# Patient Record
Sex: Female | Born: 1990 | Race: White | Hispanic: No | Marital: Single | Smoking: Current every day smoker
Health system: Southern US, Community
[De-identification: ages and names within clinical notes are randomized; demographics above are authoritative.]

## PROBLEM LIST (undated history)

## (undated) ENCOUNTER — Inpatient Hospital Stay (HOSPITAL_COMMUNITY): Payer: Self-pay

## (undated) ENCOUNTER — Inpatient Hospital Stay (HOSPITAL_COMMUNITY): Payer: Medicaid Other

## (undated) DIAGNOSIS — B9562 Methicillin resistant Staphylococcus aureus infection as the cause of diseases classified elsewhere: Secondary | ICD-10-CM

## (undated) DIAGNOSIS — N760 Acute vaginitis: Secondary | ICD-10-CM

## (undated) DIAGNOSIS — B9689 Other specified bacterial agents as the cause of diseases classified elsewhere: Secondary | ICD-10-CM

## (undated) DIAGNOSIS — D649 Anemia, unspecified: Secondary | ICD-10-CM

## (undated) DIAGNOSIS — F410 Panic disorder [episodic paroxysmal anxiety] without agoraphobia: Secondary | ICD-10-CM

## (undated) DIAGNOSIS — F32A Depression, unspecified: Secondary | ICD-10-CM

## (undated) DIAGNOSIS — F329 Major depressive disorder, single episode, unspecified: Secondary | ICD-10-CM

## (undated) DIAGNOSIS — L0390 Cellulitis, unspecified: Secondary | ICD-10-CM

## (undated) DIAGNOSIS — F909 Attention-deficit hyperactivity disorder, unspecified type: Secondary | ICD-10-CM

## (undated) HISTORY — DX: Cellulitis, unspecified: L03.90

## (undated) HISTORY — PX: INDUCED ABORTION: SHX677

## (undated) HISTORY — DX: Methicillin resistant Staphylococcus aureus infection as the cause of diseases classified elsewhere: B95.62

---
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
## 2008-01-06 ENCOUNTER — Ambulatory Visit: Payer: Self-pay | Admitting: Internal Medicine

## 2008-01-06 DIAGNOSIS — R002 Palpitations: Secondary | ICD-10-CM | POA: Insufficient documentation

## 2008-01-09 ENCOUNTER — Telehealth: Payer: Self-pay | Admitting: Internal Medicine

## 2008-01-23 DIAGNOSIS — J069 Acute upper respiratory infection, unspecified: Secondary | ICD-10-CM | POA: Insufficient documentation

## 2008-01-26 ENCOUNTER — Ambulatory Visit: Payer: Self-pay | Admitting: Family Medicine

## 2008-01-26 ENCOUNTER — Telehealth: Payer: Self-pay | Admitting: Internal Medicine

## 2008-05-13 ENCOUNTER — Emergency Department (HOSPITAL_COMMUNITY): Admission: EM | Admit: 2008-05-13 | Discharge: 2008-05-13 | Payer: Self-pay | Admitting: Emergency Medicine

## 2008-06-24 ENCOUNTER — Inpatient Hospital Stay (HOSPITAL_COMMUNITY): Admission: AD | Admit: 2008-06-24 | Discharge: 2008-06-27 | Payer: Self-pay | Admitting: Obstetrics & Gynecology

## 2009-01-01 ENCOUNTER — Encounter: Payer: Self-pay | Admitting: Internal Medicine

## 2009-07-15 ENCOUNTER — Encounter: Payer: Self-pay | Admitting: Internal Medicine

## 2011-01-11 LAB — CONVERTED CEMR LAB
Basophils Absolute: 0 10*3/uL (ref 0.0–0.1)
Basophils Relative: 0.1 % (ref 0.0–1.0)
Bilirubin Urine: NEGATIVE
Blood in Urine, dipstick: NEGATIVE
Eosinophils Absolute: 0.2 10*3/uL (ref 0.0–0.6)
Eosinophils Relative: 1.9 % (ref 0.0–5.0)
Free T4: 0.7 ng/dL (ref 0.6–1.6)
Glucose, Urine, Semiquant: NEGATIVE
HCT: 33.5 % — ABNORMAL LOW (ref 36.0–46.0)
Hemoglobin: 11.9 g/dL — ABNORMAL LOW (ref 12.0–15.0)
Ketones, urine, test strip: NEGATIVE
Lymphocytes Relative: 19.1 % (ref 12.0–46.0)
MCHC: 35.5 g/dL (ref 30.0–36.0)
MCV: 86.5 fL (ref 78.0–100.0)
Monocytes Absolute: 0.5 10*3/uL (ref 0.2–0.7)
Monocytes Relative: 5.9 % (ref 3.0–11.0)
Neutro Abs: 5.9 10*3/uL (ref 1.4–7.7)
Neutrophils Relative %: 73 % (ref 43.0–77.0)
Nitrite: NEGATIVE
Platelets: 179 10*3/uL (ref 150–400)
RBC: 3.87 M/uL (ref 3.87–5.11)
RDW: 12.4 % (ref 11.5–14.6)
Specific Gravity, Urine: 1.02
TSH: 1.47 microintl units/mL (ref 0.35–5.50)
Urobilinogen, UA: 2
WBC Urine, dipstick: NEGATIVE
WBC: 8.2 10*3/uL (ref 4.5–10.5)
pH: 7

## 2011-04-28 NOTE — Assessment & Plan Note (Signed)
[facility]                                 ON-CALL NOTE   Melissa, Levine                      MRN:          [id number]  DATE:01/25/2008                            DOB:          [birth year]    TELEPHONE NUMBER:  [phone]   CALLER:  The patient's mother calls.   The patient is four months' pregnant and has her first OB appointment  next week. However, she has had 2 to 3 days of a severe head cold and  she just feels miserable and does not know what to take. She has no  associated fever or shortness of breath in her chest. She has used  saline, but is afraid to take any other medicine. I recommended that she  can try temporary aspirin and Tylenol, and then call back as needed. If  she develops severe pain or fever, she needs to be seen earlier.     Neta Mends. Panosh, MD  Electronically Signed    WKP/MedQ  DD: 01/25/2008  DT: 01/27/2008  Job #: [id number]

## 2011-09-10 LAB — CBC
HCT: 32.5 — ABNORMAL LOW
MCHC: 33.5
MCV: 80.6
Platelets: 177
WBC: 9.8

## 2011-09-11 LAB — CBC
HCT: 24.5 — ABNORMAL LOW
MCHC: 33.8
MCV: 80.6
Platelets: 142 — ABNORMAL LOW
RDW: 15.3

## 2012-04-02 ENCOUNTER — Emergency Department (HOSPITAL_COMMUNITY)
Admission: EM | Admit: 2012-04-02 | Discharge: 2012-04-02 | Disposition: A | Discharge status: Home / Self Care | Payer: PRIVATE HEALTH INSURANCE

## 2012-04-02 ENCOUNTER — Encounter (HOSPITAL_COMMUNITY): Payer: Self-pay | Admitting: *Deleted

## 2012-04-02 HISTORY — DX: Major depressive disorder, single episode, unspecified: F32.9

## 2012-04-02 HISTORY — DX: Attention-deficit hyperactivity disorder, unspecified type: F90.9

## 2012-04-02 HISTORY — DX: Depression, unspecified: F32.A

## 2012-04-02 HISTORY — DX: Panic disorder (episodic paroxysmal anxiety): F41.0

## 2012-04-02 NOTE — ED Notes (Signed)
Went to move patient to ED treatment area. Patient states "I think I just want to go home and sleep." Patient asked what would happen when she got back to the treatment area. Informed patient that she would be evaluated by an ED provider.  Patient states she would rather just go home and go to bed. States she will call her PCP in the morning. Patient further states that she had an ear infection a week ago. States PCP gave her an antibiotic for the ear infection. Patient further states that she never took her antibiotic. Patient left ER treatment area ambulatory.

## 2012-09-28 ENCOUNTER — Encounter (HOSPITAL_COMMUNITY): Payer: Self-pay | Admitting: *Deleted

## 2012-09-28 ENCOUNTER — Emergency Department (HOSPITAL_COMMUNITY)
Admission: EM | Admit: 2012-09-28 | Discharge: 2012-09-28 | Disposition: A | Discharge status: Home / Self Care | Payer: Self-pay | Attending: Emergency Medicine | Admitting: Emergency Medicine

## 2012-09-28 ENCOUNTER — Emergency Department (HOSPITAL_COMMUNITY): Payer: Self-pay

## 2012-09-28 DIAGNOSIS — R1032 Left lower quadrant pain: Secondary | ICD-10-CM | POA: Insufficient documentation

## 2012-09-28 DIAGNOSIS — F909 Attention-deficit hyperactivity disorder, unspecified type: Secondary | ICD-10-CM | POA: Insufficient documentation

## 2012-09-28 DIAGNOSIS — Z79899 Other long term (current) drug therapy: Secondary | ICD-10-CM | POA: Insufficient documentation

## 2012-09-28 DIAGNOSIS — Z3490 Encounter for supervision of normal pregnancy, unspecified, unspecified trimester: Secondary | ICD-10-CM

## 2012-09-28 DIAGNOSIS — O99891 Other specified diseases and conditions complicating pregnancy: Secondary | ICD-10-CM | POA: Insufficient documentation

## 2012-09-28 DIAGNOSIS — F329 Major depressive disorder, single episode, unspecified: Secondary | ICD-10-CM | POA: Insufficient documentation

## 2012-09-28 DIAGNOSIS — O418X90 Other specified disorders of amniotic fluid and membranes, unspecified trimester, not applicable or unspecified: Secondary | ICD-10-CM

## 2012-09-28 DIAGNOSIS — R112 Nausea with vomiting, unspecified: Secondary | ICD-10-CM | POA: Insufficient documentation

## 2012-09-28 DIAGNOSIS — M549 Dorsalgia, unspecified: Secondary | ICD-10-CM | POA: Insufficient documentation

## 2012-09-28 DIAGNOSIS — F17200 Nicotine dependence, unspecified, uncomplicated: Secondary | ICD-10-CM | POA: Insufficient documentation

## 2012-09-28 DIAGNOSIS — F3289 Other specified depressive episodes: Secondary | ICD-10-CM | POA: Insufficient documentation

## 2012-09-28 LAB — URINALYSIS, ROUTINE W REFLEX MICROSCOPIC
Bilirubin Urine: NEGATIVE
Hgb urine dipstick: NEGATIVE
Ketones, ur: NEGATIVE mg/dL
Protein, ur: NEGATIVE mg/dL
Specific Gravity, Urine: 1.017 (ref 1.005–1.030)
Urobilinogen, UA: 0.2 mg/dL (ref 0.0–1.0)

## 2012-09-28 LAB — WET PREP, GENITAL
Clue Cells Wet Prep HPF POC: NONE SEEN
Trich, Wet Prep: NONE SEEN
Yeast Wet Prep HPF POC: NONE SEEN

## 2012-09-28 LAB — URINE MICROSCOPIC-ADD ON

## 2012-09-28 LAB — PREGNANCY, URINE: Preg Test, Ur: POSITIVE — AB

## 2012-09-28 MED ORDER — PROMETHAZINE HCL 25 MG PO TABS: 25.0000 mg | ORAL_TABLET | Freq: Four times a day (QID) | ORAL | Status: DC | PRN

## 2012-09-28 NOTE — ED Provider Notes (Signed)
History     CSN: [id number]  Arrival date & time 09/28/12  4098   First MD Initiated Contact with Patient 09/28/12 0329      Chief Complaint  Patient presents with  . Back and abdominal pain     (Consider location/radiation/quality/duration/timing/severity/associated sxs/prior treatment) HPI History provided by pt.   Pt has had constant nausea and daily episodes of vomiting for the past 2 weeks.  Took a positive pregnancy test one week ago.  This is her second pregnancy.  Was vomiting in the car while driving this morning and developed severe pain in her left lower abdomen that radiated straight through to her back shortly after.  Pain has persisted and no improvement w/ ibuprofen.  Pt has not had any recent fever, vaginal bleeding or other GU sx or diarrhea.  No recent trauma to back or heavy lifting.   Past Medical History  Diagnosis Date  . ADHD (attention deficit hyperactivity disorder)   . Panic attacks   . Depression     History reviewed. No pertinent past surgical history.  No family history on file.  History  Substance Use Topics  . Smoking status: Current Every Day Smoker -- 1.0 packs/day    Types: Cigarettes  . Smokeless tobacco: Not on file  . Alcohol Use: No    OB History    Grav Para Term Preterm Abortions TAB SAB Ect Mult Living                  Review of Systems  All other systems reviewed and are negative.    Allergies  Review of patient's allergies indicates no known allergies.  Home Medications   Current Outpatient Rx  Name Route Sig Dispense Refill  . AMPHETAMINE-DEXTROAMPHETAMINE 20 MG PO TABS Oral Take 20-40 mg by mouth daily.    . IBUPROFEN 200 MG PO TABS Oral Take 400 mg by mouth every 8 (eight) hours as needed. For pain    . LORAZEPAM 0.5 MG PO TABS Oral Take 0.5 mg by mouth every 8 (eight) hours as needed. For anxiety.      BP 121/73  Pulse 103  Temp 98.4 F (36.9 C) (Oral)  Resp 17  Ht 5\' 7"  (1.702 m)  Wt 155 lb 12.8 oz  (70.67 kg)  BMI 24.40 kg/m2  SpO2 98%  LMP 08/02/2012  Physical Exam  Nursing note and vitals reviewed. Constitutional: She is oriented to person, place, and time. She appears well-developed and well-nourished. No distress.  HENT:  Head: Normocephalic and atraumatic.  Eyes:       Normal appearance  Neck: Normal range of motion.  Cardiovascular: Normal rate and regular rhythm.   Pulmonary/Chest: Effort normal and breath sounds normal. No respiratory distress.  Abdominal: Soft. Bowel sounds are normal. She exhibits no distension and no mass. There is no rebound and no guarding.       LLQ tenderness  Genitourinary:       No CVA tenderness.  Nml external genitalia.  No vaginal discharge/bleeding.  Cervix closed and appears nml.  Mild diffuse tenderness on bimanual exam but no true adnexal or cervical motion tenderess.    Musculoskeletal: Normal range of motion.  Neurological: She is alert and oriented to person, place, and time.  Skin: Skin is warm and dry. No rash noted.  Psychiatric: She has a normal mood and affect. Her behavior is normal.    ED Course  Procedures (including critical care time)  Labs Reviewed  PREGNANCY, URINE - Abnormal;  Notable for the following:    Preg Test, Ur POSITIVE (*)     All other components within normal limits  URINALYSIS, ROUTINE W REFLEX MICROSCOPIC - Abnormal; Notable for the following:    APPearance TURBID (*)     Leukocytes, UA TRACE (*)     All other components within normal limits  URINE MICROSCOPIC-ADD ON - Abnormal; Notable for the following:    Squamous Epithelial / LPF FEW (*)     All other components within normal limits  WET PREP, GENITAL  GC/CHLAMYDIA PROBE AMP, GENITAL   US Ob Comp Less 14 Wks  09/28/2012  *RADIOLOGY REPORT*  Clinical Data: Left-sided abdominal pain for 1 day.  Estimated gestational age by LMP is 8 weeks 1 day.  Positive urine pregnancy test.  OBSTETRIC <14 WK [place] AND TRANSVAGINAL OB US  Technique:  Both  transabdominal and transvaginal ultrasound examinations were performed for complete evaluation of the gestation as well as the maternal uterus, adnexal regions, and pelvic cul-de-sac.  Transvaginal technique was performed to assess early pregnancy.  Comparison:  None.  Intrauterine gestational sac:  Single intrauterine gestational sac is visualized.  Small to moderate sized subchorionic hemorrhage inferiorly. Yolk sac: Present. Embryo: Present. Cardiac Activity: Visualized. Heart Rate: 160 bpm  CRL: 16.5  mm  8 w  1 d             [place] EDC: 05/09/2013  Maternal uterus/adnexae: The uterus is anteverted.  No myometrial masses.  Both ovaries are visualized.  Right ovary measures 3.2 x 2.7 x 3.8 cm.  Left ovary measures 1.7 x 2.1 x 4.1 cm.  Normal follicular changes bilaterally.  Flow is demonstrated in both ovaries on color flow Doppler images.  IMPRESSION: Single intrauterine pregnancy.  Estimated gestational age by crown- rump length is 8 weeks 1 day.  Small moderate sized subchorionic hemorrhage is suggested.   Original Report Authenticated By: Marlon Pel, M.D.    US Ob Transvaginal  09/28/2012  *RADIOLOGY REPORT*  Clinical Data: Left-sided abdominal pain for 1 day.  Estimated gestational age by LMP is 8 weeks 1 day.  Positive urine pregnancy test.  OBSTETRIC <14 WK [place] AND TRANSVAGINAL OB US  Technique:  Both transabdominal and transvaginal ultrasound examinations were performed for complete evaluation of the gestation as well as the maternal uterus, adnexal regions, and pelvic cul-de-sac.  Transvaginal technique was performed to assess early pregnancy.  Comparison:  None.  Intrauterine gestational sac:  Single intrauterine gestational sac is visualized.  Small to moderate sized subchorionic hemorrhage inferiorly. Yolk sac: Present. Embryo: Present. Cardiac Activity: Visualized. Heart Rate: 160 bpm  CRL: 16.5  mm  8 w  1 d             [place] EDC: 05/09/2013  Maternal uterus/adnexae: The uterus is anteverted.   No myometrial masses.  Both ovaries are visualized.  Right ovary measures 3.2 x 2.7 x 3.8 cm.  Left ovary measures 1.7 x 2.1 x 4.1 cm.  Normal follicular changes bilaterally.  Flow is demonstrated in both ovaries on color flow Doppler images.  IMPRESSION: Single intrauterine pregnancy.  Estimated gestational age by crown- rump length is 8 weeks 1 day.  Small moderate sized subchorionic hemorrhage is suggested.   Original Report Authenticated By: Marlon Pel, M.D.      1. Intrauterine pregnancy   2. Subchorionic hemorrhage   3. Nausea and vomiting       MDM  21yo healthy pregnant F presents w/ c/o severe left  lower abd pain w/ radiation to back.  On exam, pt looks well, LLQ tenderness, nml genitalia.  U/A neg for infection.  Transvaginal US ordered to confirm IUP.  Pt declines pain and nausea medication at this time.  4:33 AM   US shows small inferior sub-chorionic hemorrhage and IUP.  Results discussed w/ pt.  Pt referred to [facility].  Advised tylenol only for pain and prescribed promethazine.  Pt stable at time of discharge.  Return precautions discussed. 5:44 AM       Otilio Miu, PA 09/28/12 [phone]

## 2012-09-28 NOTE — ED Notes (Signed)
Pt c/o lower back and abd pain; nausea and vomiting x 2 wks; positive pregnancy test yesterday

## 2012-09-28 NOTE — ED Provider Notes (Signed)
Medical screening examination/treatment/procedure(s) were performed by non-physician practitioner and as supervising physician I was immediately available for consultation/collaboration.   Hanley Seamen, MD 09/28/12 [phone]

## 2012-09-29 ENCOUNTER — Inpatient Hospital Stay (HOSPITAL_COMMUNITY)
Admission: AD | Admit: 2012-09-29 | Discharge: 2012-09-29 | Disposition: A | Discharge status: Home / Self Care | Payer: Self-pay | Source: Ambulatory Visit | Attending: Family Medicine | Admitting: Family Medicine

## 2012-09-29 ENCOUNTER — Encounter (HOSPITAL_COMMUNITY): Payer: Self-pay

## 2012-09-29 DIAGNOSIS — R1032 Left lower quadrant pain: Secondary | ICD-10-CM | POA: Insufficient documentation

## 2012-09-29 DIAGNOSIS — K5900 Constipation, unspecified: Secondary | ICD-10-CM | POA: Insufficient documentation

## 2012-09-29 DIAGNOSIS — N949 Unspecified condition associated with female genital organs and menstrual cycle: Secondary | ICD-10-CM

## 2012-09-29 DIAGNOSIS — O99891 Other specified diseases and conditions complicating pregnancy: Secondary | ICD-10-CM | POA: Insufficient documentation

## 2012-09-29 DIAGNOSIS — IMO0002 Reserved for concepts with insufficient information to code with codable children: Secondary | ICD-10-CM

## 2012-09-29 LAB — GC/CHLAMYDIA PROBE AMP, GENITAL: GC Probe Amp, Genital: NEGATIVE

## 2012-09-29 MED ORDER — PROMETHAZINE HCL 25 MG PO TABS: 12.5000 mg | ORAL_TABLET | Freq: Four times a day (QID) | ORAL | Status: DC | PRN

## 2012-09-29 MED ORDER — OXYCODONE-ACETAMINOPHEN 5-325 MG PO TABS: 1.0000 | ORAL_TABLET | ORAL | Status: DC | PRN

## 2012-09-29 MED ORDER — OXYCODONE-ACETAMINOPHEN 5-325 MG PO TABS
1.0000 | ORAL_TABLET | Freq: Once | ORAL | Status: AC
  Administered 2012-09-29: 1 via ORAL
  Filled 2012-09-29: qty 1

## 2012-09-29 NOTE — MAU Provider Note (Signed)
Chief Complaint: No chief complaint on file.   First Provider Initiated Contact with Patient 09/29/12 1625     SUBJECTIVE HPI: Melissa Levine is a 21 y.o. G2P1001 at [redacted]w[redacted]d by LMP who presents with lower abd pain, L>R over the past few days, short, sharp LLQ pain after vomiting last night, resolved. Seen at [facility] yesterday for same. SIUP, S=D, mod [facility] seen.  Denies bleeding, vaginal discharge, fever, chills, urinary complaints. Last 4 days ago. Suffering from constipation since beginning of pregnancy. Has not tried anything. Pt tearful. States she was considering termination for financial reasons and to finish school, but was further along than she thought and told that it cannot be done by [facility].    Past Medical History  Diagnosis Date  . ADHD (attention deficit hyperactivity disorder)   . Panic attacks   . Depression    OB History    Grav Para Term Preterm Abortions TAB SAB Ect Mult Living   2 1 1  0 0 0 0 0 0 1     # Outc Date GA Lbr Len/2nd Wgt Sex Del Anes PTL Lv   1 TRM            2 CUR              History reviewed. No pertinent past surgical history. History   Social History  . Marital Status: Single    Spouse Name: N/A    Number of Children: N/A  . Years of Education: N/A   Occupational History  . Not on file.   Social History Main Topics  . Smoking status: Current Every Day Smoker -- 1.0 packs/day    Types: Cigarettes  . Smokeless tobacco: Not on file  . Alcohol Use: No  . Drug Use: No  . Sexually Active: Yes    Birth Control/ Protection: Condom   Other Topics Concern  . Not on file   Social History Narrative  . No narrative on file   No current facility-administered medications on file prior to encounter.   Current Outpatient Prescriptions on File Prior to Encounter  Medication Sig Dispense Refill  . amphetamine-dextroamphetamine (ADDERALL) 20 MG tablet Take 20-40 mg by mouth daily.      Marland Kitchen LORazepam (ATIVAN) 0.5 MG tablet Take 0.5 mg by  mouth every 8 (eight) hours as needed. For anxiety.       No Known Allergies  ROS: Pertinent items in HPI  OBJECTIVE Blood pressure 126/70, pulse 76, temperature 98.4 F (36.9 C), temperature source Oral, resp. rate 16, height 5\' 7"  (1.702 m), weight 69.627 kg (153 lb 8 oz), last menstrual period 08/02/2012. GENERAL: Well-developed, well-nourished female in mild distress, anxious.  HEENT: Normocephalic HEART: normal rate RESP: normal effort ABDOMEN: Soft, mild diffuse left abd tenderness. ?Stool palpated in transverse colon. Pos BS.  EXTREMITIES: Nontender, no edema NEURO: Alert and oriented SPECULUM EXAM: deferred BIMANUAL: cervix long and closed; uterus normal size, no adnexal tenderness or masses    LAB RESULTS No results found for this or any previous visit (from the past 24 hour(s)).  IMAGING Pos fetal cardiac activity per informal BS [place].  MAU COURSE Pain resolved w/ 1 percocet.   ASSESSMENT 1. Constipation in pregnancy   2. Round ligament pain    PLAN Discharge home Support given. Increase fluids, fiber. Take Miralax daily until soft, daily BM. Fleet's enema in no BM in 48 hours.  Encouraged pt to Tx Constipation and N/V before making decision about termination.  Follow-up  Information    Follow up with [facility] [place] [facility]. (As needed if symptoms worsen)    Contact information:   [address] [id number] mc [place] [address] [phone]      Follow up with Dr. Arlyce Dice and Dr. Tenny Craw.          Medication List     As of 09/29/2012  8:45 PM    STOP taking these medications         LORazepam 0.5 MG tablet   Commonly known as: ATIVAN      TAKE these medications         amphetamine-dextroamphetamine 20 MG tablet   Commonly known as: ADDERALL   Take 20-40 mg by mouth daily.      oxyCODONE-acetaminophen 5-325 MG per tablet   Commonly known as: PERCOCET/ROXICET   Take 1 tablet by mouth every 4  (four) hours as needed for pain.      promethazine 25 MG tablet   Commonly known as: PHENERGAN   Take 0.5-1 tablets (12.5-25 mg total) by mouth every 6 (six) hours as needed for nausea.         [place], CNM 09/29/2012  5:08 PM

## 2012-09-29 NOTE — MAU Note (Signed)
Pt states lower abd pain moreso on left side, told she has [facility], however has not had bleeding. Denies abnormal vaginal discharge.

## 2012-09-30 NOTE — MAU Provider Note (Signed)
Chart reviewed and agree with management and plan.  

## 2012-10-06 ENCOUNTER — Encounter: Payer: Self-pay | Admitting: Family Medicine

## 2012-10-25 ENCOUNTER — Inpatient Hospital Stay (HOSPITAL_COMMUNITY): Payer: Self-pay

## 2012-10-25 ENCOUNTER — Encounter (HOSPITAL_COMMUNITY): Payer: Self-pay | Admitting: *Deleted

## 2012-10-25 ENCOUNTER — Inpatient Hospital Stay (HOSPITAL_COMMUNITY)
Admission: AD | Admit: 2012-10-25 | Discharge: 2012-10-25 | Disposition: A | Discharge status: Home / Self Care | Payer: Self-pay | Source: Ambulatory Visit | Attending: Obstetrics and Gynecology | Admitting: Obstetrics and Gynecology

## 2012-10-25 DIAGNOSIS — N939 Abnormal uterine and vaginal bleeding, unspecified: Secondary | ICD-10-CM

## 2012-10-25 DIAGNOSIS — N898 Other specified noninflammatory disorders of vagina: Secondary | ICD-10-CM

## 2012-10-25 DIAGNOSIS — O046 Delayed or excessive hemorrhage following (induced) termination of pregnancy: Secondary | ICD-10-CM | POA: Insufficient documentation

## 2012-10-25 LAB — CBC
Hemoglobin: 10.3 g/dL — ABNORMAL LOW (ref 12.0–15.0)
MCH: 29.7 pg (ref 26.0–34.0)
MCHC: 34.1 g/dL (ref 30.0–36.0)
RDW: 13.9 % (ref 11.5–15.5)

## 2012-10-25 MED ORDER — MISOPROSTOL 200 MCG PO TABS
800.0000 ug | ORAL_TABLET | Freq: Once | ORAL | Status: AC
  Administered 2012-10-25: 800 ug via RECTAL
  Filled 2012-10-25: qty 4

## 2012-10-25 MED ORDER — HYDROCODONE-ACETAMINOPHEN 5-325 MG PO TABS: 2.0000 | ORAL_TABLET | ORAL | Status: DC | PRN

## 2012-10-25 MED ORDER — LACTATED RINGERS IV BOLUS (SEPSIS)
1000.0000 mL | Freq: Once | INTRAVENOUS | Status: AC
  Administered 2012-10-25: 1000 mL via INTRAVENOUS

## 2012-10-25 MED ORDER — METHYLERGONOVINE MALEATE 0.2 MG PO TABS: 0.2000 mg | ORAL_TABLET | Freq: Four times a day (QID) | ORAL | Status: DC

## 2012-10-25 NOTE — MAU Note (Signed)
Pt reports she had a termination at 12 weeks preg 5 days ago, pain off/on since then, vomiting Sunday night and Monday. Today started having heaving bleeding and passing large clots. Changing a pad q 30 minutes.

## 2012-10-25 NOTE — MAU Provider Note (Signed)
History     CSN: [id number]  Arrival date and time: 10/25/12 1834   None     Chief Complaint  Patient presents with  . Vaginal Bleeding   HPI Melissa Levine is a 21 y.o. female who presents to MAU with vaginal bleeding. She reports having had an EAB 10/21/12 @ [redacted] weeks gestation. She had cramping after the procedure and developed a migraine. She felt better on 11/10 but today began having heavy bleeding, passing clots, saturating more than a pad an hour and feeling weak and dizzy. The history was provided by the patient.  OB History    Grav Para Term Preterm Abortions TAB SAB Ect Mult Living   2 1 1  0 0 0 0 0 0 1      Past Medical History  Diagnosis Date  . ADHD (attention deficit hyperactivity disorder)   . Panic attacks   . Depression     Past Surgical History  Procedure Date  . Induced abortion     History reviewed. No pertinent family history.  History  Substance Use Topics  . Smoking status: Current Every Day Smoker -- 1.0 packs/day    Types: Cigarettes  . Smokeless tobacco: Not on file  . Alcohol Use: No    Allergies: No Known Allergies  Prescriptions prior to admission  Medication Sig Dispense Refill  . amphetamine-dextroamphetamine (ADDERALL) 20 MG tablet Take 20-40 mg by mouth daily.      Marland Kitchen ibuprofen (ADVIL,[name] Take 400 mg by mouth every 6 (six) hours as needed. Takes for pain      . promethazine (PHENERGAN) 25 MG tablet Take 0.5-1 tablets (12.5-25 mg total) by mouth every 6 (six) hours as needed for nausea.  30 tablet  1    Review of Systems  Constitutional: Positive for malaise/fatigue. Negative for fever and chills.  Eyes: Negative for blurred vision and double vision.  Respiratory: Negative for cough.   Cardiovascular: Negative for chest pain.  Gastrointestinal: Positive for nausea and abdominal pain.  Genitourinary: Negative for dysuria.  Musculoskeletal: Negative for back pain.  Skin: Negative for rash.  Neurological:  Positive for dizziness and headaches.   Physical Exam   Blood pressure 114/73, pulse 130, temperature 98.6 F (37 C), temperature source Oral, resp. rate 20, height 5\' 7"  (1.702 m), weight 68.04 kg (150 lb), last menstrual period 08/02/2012, SpO2 100.00%.  Physical Exam  Nursing note and vitals reviewed. Constitutional: She is oriented to person, place, and time. She appears well-developed and well-nourished. No distress.  HENT:  Head: Normocephalic and atraumatic.  Eyes: EOM are normal.  Neck: Neck supple.  Cardiovascular:       Tachycardia   Respiratory: Effort normal.  GI: Soft. There is tenderness. There is no rebound and no guarding.       Lower abdomen  Musculoskeletal: Normal range of motion.  Neurological: She is alert and oriented to person, place, and time.  Skin: Skin is warm and dry.  Psychiatric: She has a normal mood and affect. Her behavior is normal. Judgment and thought content normal.   Results for orders placed during the hospital encounter of 10/25/12 (from the past 24 hour(s))  CBC     Status: Abnormal   Collection Time   10/25/12  7:35 PM      Component Value Range   WBC 5.7  4.0 - 10.5 K/uL   RBC 3.47 (*) 3.87 - 5.11 MIL/uL   Hemoglobin 10.3 (*) 12.0 - 15.0 g/dL  HCT 30.2 (*) 36.0 - 46.0 %   MCV 87.0  78.0 - 100.0 fL   MCH 29.7  26.0 - 34.0 pg   MCHC 34.1  30.0 - 36.0 g/dL   RDW 16.1  09.6 - 04.5 %   Platelets 189  150 - 400 K/uL   Assessment: 21 y.o. female with heavy vaginal bleeding s/p EAB  Plan:  IV LR bolus   Cytotec 800 mcg  Clinical Data: Heavy vaginal bleeding, history of abortion on  10/21/2012  TRANSVAGINAL ULTRASOUND OF PELVIS  Technique: Transvaginal ultrasound examination of the pelvis was  performed including evaluation of the uterus, ovaries, adnexal  regions, and pelvic cul-de-sac.  Comparison: US OB on 09/28/2012  Findings:  Uterus: The uterus is enlarged secondary to recent pregnancy, and  was not measured.  Endometrium:  The endometrium is thickened and inhomogeneous  measuring up to 24.6 mm. This inhomogeneous material most like  represents blood clot, but retained products would be difficult to  exclude.  Right ovary: Not visualized.  Left ovary: Not visualized.  Other Findings: No free fluid is seen.  IMPRESSION:  1. Inhomogeneous material within the endometrium most likely  represents blood clot. Retained products of conception would be  difficult to exclude.  2. Neither ovary is visualized.   MAU Course: discussed with Dr. Arlyce Dice on call for Dr. Tenny Craw  Procedures Care turned over to Nolene Bernheim, FNP Consult with Dr. Arlyce Dice x 2.   Discussed plan of care with client as well.  Assessment Bleeding after TAB  Plan Rx methergine 0.2 mg PO q 6h (#10) no refills rx vicodin one po q 6 hours as needed for pain (#6) no refills Call the clinic if you are continuing to have problems. NEESE,HOPE, RN, FNP, [facility] 10/25/2012, 9:00 PM

## 2012-12-10 ENCOUNTER — Encounter (HOSPITAL_COMMUNITY): Payer: Self-pay | Admitting: *Deleted

## 2012-12-10 ENCOUNTER — Emergency Department (HOSPITAL_COMMUNITY): Payer: Self-pay

## 2012-12-10 ENCOUNTER — Emergency Department (HOSPITAL_COMMUNITY)
Admission: EM | Admit: 2012-12-10 | Discharge: 2012-12-10 | Disposition: A | Discharge status: Home / Self Care | Payer: Self-pay | Attending: Emergency Medicine | Admitting: Emergency Medicine

## 2012-12-10 DIAGNOSIS — K2970 Gastritis, unspecified, without bleeding: Secondary | ICD-10-CM | POA: Insufficient documentation

## 2012-12-10 DIAGNOSIS — K2990 Gastroduodenitis, unspecified, without bleeding: Secondary | ICD-10-CM | POA: Insufficient documentation

## 2012-12-10 DIAGNOSIS — Z3202 Encounter for pregnancy test, result negative: Secondary | ICD-10-CM | POA: Insufficient documentation

## 2012-12-10 DIAGNOSIS — F17200 Nicotine dependence, unspecified, uncomplicated: Secondary | ICD-10-CM | POA: Insufficient documentation

## 2012-12-10 DIAGNOSIS — Z8659 Personal history of other mental and behavioral disorders: Secondary | ICD-10-CM | POA: Insufficient documentation

## 2012-12-10 DIAGNOSIS — R112 Nausea with vomiting, unspecified: Secondary | ICD-10-CM | POA: Insufficient documentation

## 2012-12-10 DIAGNOSIS — R109 Unspecified abdominal pain: Secondary | ICD-10-CM | POA: Insufficient documentation

## 2012-12-10 DIAGNOSIS — F909 Attention-deficit hyperactivity disorder, unspecified type: Secondary | ICD-10-CM | POA: Insufficient documentation

## 2012-12-10 LAB — COMPREHENSIVE METABOLIC PANEL
ALT: 10 U/L (ref 0–35)
AST: 18 U/L (ref 0–37)
Albumin: 4.2 g/dL (ref 3.5–5.2)
Alkaline Phosphatase: 59 U/L (ref 39–117)
BUN: 14 mg/dL (ref 6–23)
CO2: 24 mEq/L (ref 19–32)
Calcium: 9 mg/dL (ref 8.4–10.5)
Chloride: 102 mEq/L (ref 96–112)
Creatinine, Ser: 0.48 mg/dL — ABNORMAL LOW (ref 0.50–1.10)
GFR calc Af Amer: >90 mL/min (ref >90)
GFR calc non Af Amer: >90 mL/min (ref >90)
Glucose, Bld: 96 mg/dL (ref 70–99)
Potassium: 3.6 mEq/L (ref 3.5–5.1)
Sodium: 137 mEq/L (ref 135–145)
Total Bilirubin: 0.5 mg/dL (ref 0.3–1.2)
Total Protein: 7.3 g/dL (ref 6.0–8.3)

## 2012-12-10 LAB — URINALYSIS, ROUTINE W REFLEX MICROSCOPIC
Bilirubin Urine: NEGATIVE
Protein, ur: NEGATIVE mg/dL
Urobilinogen, UA: 1 mg/dL (ref 0.0–1.0)

## 2012-12-10 LAB — CBC WITH DIFFERENTIAL/PLATELET
Basophils Absolute: 0 10*3/uL (ref 0.0–0.1)
HCT: 37.5 % (ref 36.0–46.0)
Hemoglobin: 12.4 g/dL (ref 12.0–15.0)
Lymphocytes Relative: 7 % — ABNORMAL LOW (ref 12–46)
Lymphs Abs: 0.7 10*3/uL (ref 0.7–4.0)
Monocytes Absolute: 0.5 10*3/uL (ref 0.1–1.0)
Monocytes Relative: 5 % (ref 3–12)
Neutro Abs: 8.7 10*3/uL — ABNORMAL HIGH (ref 1.7–7.7)
RBC: 4.49 MIL/uL (ref 3.87–5.11)
RDW: 13.1 % (ref 11.5–15.5)
WBC: 10 10*3/uL (ref 4.0–10.5)

## 2012-12-10 LAB — POCT PREGNANCY, URINE: Preg Test, Ur: NEGATIVE

## 2012-12-10 LAB — URINE MICROSCOPIC-ADD ON

## 2012-12-10 LAB — WET PREP, GENITAL
Clue Cells Wet Prep HPF POC: NONE SEEN
Trich, Wet Prep: NONE SEEN
Yeast Wet Prep HPF POC: NONE SEEN

## 2012-12-10 MED ORDER — HYDROMORPHONE HCL PF 1 MG/ML IJ SOLN
1.0000 mg | Freq: Once | INTRAMUSCULAR | Status: AC
  Administered 2012-12-10: 1 mg via INTRAVENOUS
  Filled 2012-12-10: qty 1

## 2012-12-10 MED ORDER — DICYCLOMINE HCL 20 MG PO TABS: 20.0000 mg | ORAL_TABLET | Freq: Two times a day (BID) | ORAL | Status: DC

## 2012-12-10 MED ORDER — SODIUM CHLORIDE 0.9 % IV BOLUS (SEPSIS)
1000.0000 mL | Freq: Once | INTRAVENOUS | Status: AC
  Administered 2012-12-10: 1000 mL via INTRAVENOUS

## 2012-12-10 MED ORDER — ONDANSETRON 8 MG PO TBDP: ORAL_TABLET | ORAL | Status: DC

## 2012-12-10 MED ORDER — GI COCKTAIL ~~LOC~~
30.0000 mL | Freq: Once | ORAL | Status: AC
  Administered 2012-12-10: 30 mL via ORAL
  Filled 2012-12-10: qty 30

## 2012-12-10 NOTE — ED Notes (Signed)
Pt aware of the need for a urine sample. 

## 2012-12-10 NOTE — ED Notes (Signed)
Pelvic supplies at bedside. 

## 2012-12-10 NOTE — ED Notes (Signed)
Started to vomit this am about 0630. Abd has burnging feeling, no diarrhea

## 2012-12-10 NOTE — ED Notes (Signed)
RN to obtain labs with start of IV 

## 2012-12-10 NOTE — ED Notes (Signed)
Patient transported to Ultrasound 

## 2012-12-10 NOTE — ED Provider Notes (Signed)
History     CSN: [id number]  Arrival date & time 12/10/12  1206   First MD Initiated Contact with Patient 12/10/12 1246      Chief Complaint  Patient presents with  . Abdominal Pain  . Emesis    (Consider location/radiation/quality/duration/timing/severity/associated sxs/prior treatment) HPI Comments: Patient presents with complaint of nausea and vomiting X 3.  Associated symptoms include epigastric pain. Of note patient had a TAB on 11/7 and was [facility] 11[facility]12 for vaginal bleeding. Imaging at that time could not rule out retained products of conception. Patient states that she did not purchase cytotec due to cost. LMP: 1.5 weeks ago. Denies fever or chills. Denies diarrhea, hematochezia or hematemeisis. Denies history of GERD. Family history significant for gallstones. Denies excessive ETOH use.  The history is provided by the patient. No language interpreter was used.    Past Medical History  Diagnosis Date  . ADHD (attention deficit hyperactivity disorder)   . Panic attacks   . Depression     Past Surgical History  Procedure Date  . Induced abortion     No family history on file.  History  Substance Use Topics  . Smoking status: Current Every Day Smoker -- 1.0 packs/day    Types: Cigarettes  . Smokeless tobacco: Not on file  . Alcohol Use: No    OB History    Grav Para Term Preterm Abortions TAB SAB Ect Mult Living   2 1 1  0 1 1 0 0 0 1      Review of Systems  Constitutional: Negative for fever and chills.  Gastrointestinal: Positive for nausea, vomiting and abdominal pain.  Genitourinary: Negative for vaginal bleeding and vaginal discharge.    Allergies  Review of patient's allergies indicates no known allergies.  Home Medications   Current Outpatient Rx  Name  Route  Sig  Dispense  Refill  . AMPHETAMINE-DEXTROAMPHETAMINE 20 MG PO TABS   Oral   Take 10 mg by mouth 2 (two) times daily.          Marland Kitchen PROMETHAZINE HCL 25 MG PO TABS  Oral   Take 0.5-1 tablets (12.5-25 mg total) by mouth every 6 (six) hours as needed for nausea.   30 tablet   1   . PSEUDOEPH-DOXYLAMINE-DM-APAP 60-7.05-12-999 MG/30ML PO LIQD   Oral   Take 20 mLs by mouth once.           BP 110/62  Pulse 107  Temp 98.6 F (37 C) (Oral)  Resp 18  SpO2 99%  LMP 11/26/2012  Physical Exam  Nursing note and vitals reviewed. Constitutional: She appears well-developed and well-nourished.  HENT:  Head: Normocephalic and atraumatic.  Mouth/Throat: Oropharynx is clear and moist.  Eyes: Conjunctivae normal and EOM are normal. No scleral icterus.  Neck: Normal range of motion. Neck supple.  Cardiovascular: Normal rate, regular rhythm and normal heart sounds.   Pulmonary/Chest: Effort normal and breath sounds normal.  Abdominal: Soft. Bowel sounds are normal. She exhibits no distension. There is tenderness.       Tenderness to palpation of the epigastrium, LLQ, RLQ, and suprapubic area.  Genitourinary: No vaginal discharge found.       No cervical motion tenderness. Mild tenderness on the left adnexa.   Neurological: She is alert.  Skin: Skin is warm and dry.    ED Course  Procedures (including critical care time)  Labs Reviewed  CBC WITH DIFFERENTIAL - Abnormal; Notable for the following:    Neutrophils Relative  87 (*)     Neutro Abs 8.7 (*)     Lymphocytes Relative 7 (*)     All other components within normal limits  COMPREHENSIVE METABOLIC PANEL - Abnormal; Notable for the following:    Creatinine, Ser 0.48 (*)     All other components within normal limits  POCT PREGNANCY, URINE  URINALYSIS, ROUTINE W REFLEX MICROSCOPIC   Results for orders placed during the hospital encounter of 12/10/12  CBC WITH DIFFERENTIAL      Component Value Range   WBC 10.0  4.0 - 10.5 K/uL   RBC 4.49  3.87 - 5.11 MIL/uL   Hemoglobin 12.4  12.0 - 15.0 g/dL   HCT 82.9  56.2 - 13.0 %   MCV 83.5  78.0 - 100.0 fL   MCH 27.6  26.0 - 34.0 pg   MCHC 33.1  30.0 -  36.0 g/dL   RDW 86.5  78.4 - 69.6 %   Platelets 190  150 - 400 K/uL   Neutrophils Relative 87 (*) 43 - 77 %   Neutro Abs 8.7 (*) 1.7 - 7.7 K/uL   Lymphocytes Relative 7 (*) 12 - 46 %   Lymphs Abs 0.7  0.7 - 4.0 K/uL   Monocytes Relative 5  3 - 12 %   Monocytes Absolute 0.5  0.1 - 1.0 K/uL   Eosinophils Relative 1  0 - 5 %   Eosinophils Absolute 0.1  0.0 - 0.7 K/uL   Basophils Relative 0  0 - 1 %   Basophils Absolute 0.0  0.0 - 0.1 K/uL  COMPREHENSIVE METABOLIC PANEL      Component Value Range   Sodium 137  135 - 145 mEq/L   Potassium 3.6  3.5 - 5.1 mEq/L   Chloride 102  96 - 112 mEq/L   CO2 24  19 - 32 mEq/L   Glucose, Bld 96  70 - 99 mg/dL   BUN 14  6 - 23 mg/dL   Creatinine, Ser 2.95 (*) 0.50 - 1.10 mg/dL   Calcium 9.0  8.4 - 28.4 mg/dL   Total Protein 7.3  6.0 - 8.3 g/dL   Albumin 4.2  3.5 - 5.2 g/dL   AST 18  0 - 37 U/L   ALT 10  0 - 35 U/L   Alkaline Phosphatase 59  39 - 117 U/L   Total Bilirubin 0.5  0.3 - 1.2 mg/dL   GFR calc non Af Amer >90  >90 mL/min   GFR calc Af Amer >90  >90 mL/min  URINALYSIS, ROUTINE W REFLEX MICROSCOPIC      Component Value Range   Color, Urine YELLOW  YELLOW   APPearance CLEAR  CLEAR   Specific Gravity, Urine 1.034 (*) 1.005 - 1.030   pH 6.0  5.0 - 8.0   Glucose, UA NEGATIVE  NEGATIVE mg/dL   Hgb urine dipstick SMALL (*) NEGATIVE   Bilirubin Urine NEGATIVE  NEGATIVE   Ketones, ur TRACE (*) NEGATIVE mg/dL   Protein, ur NEGATIVE  NEGATIVE mg/dL   Urobilinogen, UA 1.0  0.0 - 1.0 mg/dL   Nitrite NEGATIVE  NEGATIVE   Leukocytes, UA NEGATIVE  NEGATIVE  POCT PREGNANCY, URINE      Component Value Range   Preg Test, Ur NEGATIVE  NEGATIVE  WET PREP, GENITAL      Component Value Range   Yeast Wet Prep HPF POC NONE SEEN  NONE SEEN   Trich, Wet Prep NONE SEEN  NONE SEEN   Clue Cells  Wet Prep HPF POC NONE SEEN  NONE SEEN   WBC, Wet Prep HPF POC MANY (*) NONE SEEN  URINE MICROSCOPIC-ADD ON      Component Value Range   Squamous Epithelial /  LPF RARE  RARE   WBC, UA 0-2  <3 WBC/hpf   RBC / HPF 3-6  <3 RBC/hpf   Bacteria, UA RARE  RARE   Urine-Other MUCOUS PRESENT      US Transvaginal Non-ob  12/10/2012  *RADIOLOGY REPORT*  Clinical Data: Abdominal pain.  Possible retained products of conception.  Emesis. Spotting/bleeding.  Abortion more than 2 months ago.  LMP 12/02/2012.  TRANSABDOMINAL AND TRANSVAGINAL ULTRASOUND OF PELVIS Technique:  Both transabdominal and transvaginal ultrasound examinations of the pelvis were performed. Transabdominal technique was performed for global imaging of the pelvis including uterus, ovaries, adnexal regions, and pelvic cul-de-sac.  It was necessary to proceed with endovaginal exam following the transabdominal exam to visualize the endometrium/adnexal regions.  Comparison:  10/25/2012  Findings:  Uterus: 9.2 x 4.6 x 6.3 cm.  No focal mass identified.  Endometrium: 7.1 mm, homogeneous.  Right ovary:  3.1 x 3.1 x 2.6 cm.  Normal in appearance.  Left ovary: 3.6 x 2.0 x 3.0 cm.  Small follicle is 2.0 cm.  Other findings: No free fluid  IMPRESSION:  1.  Normal appearance of the uterus and ovaries. 2.  No evidence for retained products of conception. 3.  No free pelvic fluid.   Original Report Authenticated By: Norva Pavlov, M.D.    US Pelvis Complete  12/10/2012  *RADIOLOGY REPORT*  Clinical Data: Abdominal pain.  Possible retained products of conception.  Emesis. Spotting/bleeding.  Abortion more than 2 months ago.  LMP 12/02/2012.  TRANSABDOMINAL AND TRANSVAGINAL ULTRASOUND OF PELVIS Technique:  Both transabdominal and transvaginal ultrasound examinations of the pelvis were performed. Transabdominal technique was performed for global imaging of the pelvis including uterus, ovaries, adnexal regions, and pelvic cul-de-sac.  It was necessary to proceed with endovaginal exam following the transabdominal exam to visualize the endometrium/adnexal regions.  Comparison:  10/25/2012  Findings:  Uterus: 9.2 x 4.6 x 6.3  cm.  No focal mass identified.  Endometrium: 7.1 mm, homogeneous.  Right ovary:  3.1 x 3.1 x 2.6 cm.  Normal in appearance.  Left ovary: 3.6 x 2.0 x 3.0 cm.  Small follicle is 2.0 cm.  Other findings: No free fluid  IMPRESSION:  1.  Normal appearance of the uterus and ovaries. 2.  No evidence for retained products of conception. 3.  No free pelvic fluid.   Original Report Authenticated By: Norva Pavlov, M.D.      1. Gastritis   2. Abdominal  pain, other specified site       MDM  Patient presented with complaint of abdominal pain and vomiting. Labs unremarkable. Wet prep remarkable for many bacteria. GC/chlamydia pending.  Exam and history concerning for possible retained products of conception. Repeat US normal. Patient able to tolerate PO. Given Rx for Zofran, Bentyl and return precautions. No red flags for PID or pancreatitis.         Pixie Casino, PA-C 12/10/12 1644

## 2012-12-13 NOTE — ED Provider Notes (Signed)
Medical screening examination/treatment/procedure(s) were performed by non-physician practitioner and as supervising physician I was immediately available for consultation/collaboration.   Christine Schiefelbein E Denney Shein, MD 12/13/12 1111 

## 2013-08-26 ENCOUNTER — Inpatient Hospital Stay (HOSPITAL_COMMUNITY): Payer: BC Managed Care – PPO

## 2013-08-26 ENCOUNTER — Inpatient Hospital Stay (HOSPITAL_COMMUNITY)
Admission: AD | Admit: 2013-08-26 | Discharge: 2013-08-26 | Disposition: A | Discharge status: Home / Self Care | Payer: BC Managed Care – PPO | Source: Ambulatory Visit | Attending: Obstetrics and Gynecology | Admitting: Obstetrics and Gynecology

## 2013-08-26 ENCOUNTER — Encounter (HOSPITAL_COMMUNITY): Payer: Self-pay | Admitting: *Deleted

## 2013-08-26 DIAGNOSIS — R42 Dizziness and giddiness: Secondary | ICD-10-CM | POA: Insufficient documentation

## 2013-08-26 DIAGNOSIS — R109 Unspecified abdominal pain: Secondary | ICD-10-CM | POA: Insufficient documentation

## 2013-08-26 DIAGNOSIS — O200 Threatened abortion: Secondary | ICD-10-CM | POA: Insufficient documentation

## 2013-08-26 DIAGNOSIS — R51 Headache: Secondary | ICD-10-CM | POA: Insufficient documentation

## 2013-08-26 DIAGNOSIS — Z3490 Encounter for supervision of normal pregnancy, unspecified, unspecified trimester: Secondary | ICD-10-CM

## 2013-08-26 HISTORY — DX: Other specified bacterial agents as the cause of diseases classified elsewhere: B96.89

## 2013-08-26 HISTORY — DX: Acute vaginitis: N76.0

## 2013-08-26 LAB — GLUCOSE, CAPILLARY: Glucose-Capillary: 90 mg/dL (ref 70–99)

## 2013-08-26 LAB — CBC
HCT: 36.6 % (ref 36.0–46.0)
RBC: 4.33 MIL/uL (ref 3.87–5.11)
RDW: 13.1 % (ref 11.5–15.5)
WBC: 7.9 10*3/uL (ref 4.0–10.5)

## 2013-08-26 LAB — WET PREP, GENITAL
Clue Cells Wet Prep HPF POC: NONE SEEN
Trich, Wet Prep: NONE SEEN

## 2013-08-26 LAB — URINALYSIS, ROUTINE W REFLEX MICROSCOPIC
Bilirubin Urine: NEGATIVE
Glucose, UA: NEGATIVE mg/dL
Ketones, ur: NEGATIVE mg/dL
pH: 7 (ref 5.0–8.0)

## 2013-08-26 LAB — HCG, QUANTITATIVE, PREGNANCY: hCG, Beta Chain, Quant, S: 6194 m[IU]/mL — ABNORMAL HIGH (ref <5)

## 2013-08-26 LAB — URINE MICROSCOPIC-ADD ON

## 2013-08-26 NOTE — Progress Notes (Signed)
Venia Carbon NP in earlier and discussed test results and d/c plan. Written and verbal d/c instructions given and understanding voiced.

## 2013-08-26 NOTE — MAU Provider Note (Signed)
History     CSN: [id number]  Arrival date and time: 08/26/13 1610   None     Chief Complaint  Patient presents with  . Abdominal Pain  . Dizziness  . Headache   HPI Melissa Levine is a 22 y.o. female; G3P1011 at unknown gestation who presents to [facility] with complaints of abdominal pain. She called the office this week and they told her to come in if the pain became worse. Yesterday the pain in her abdomen returned; the pain is in her lower left quadrant. She describes the pain in her abdomen as sharp. She is also experiencing dizziness and motion sickness; She feels dizzy when she stands up. Diet Recall: pt has had nothing to eat today, she has drank a coffee, a gatorade and some water. The last time she ate was last night at 8:30 PM.  Pt normally goes 2-3 days without eating. She is taking celexa for depression; and has not taken it in 3 days because she keeps forgetting to take it. This is an unplanned pregnancy, however she is ok with it. She does not feel sad or depressed, does not have thoughts of harm to herself or anyone else. She has an appointment scheduled with Dr. Dareen Piano in 3 days.  She has irregular menstrual cycles and her gestational age is uncertain.   OB History   Grav Para Term Preterm Abortions TAB SAB Ect Mult Living   3 1 1  0 1 1 0 0 0 1      Past Medical History  Diagnosis Date  . ADHD (attention deficit hyperactivity disorder)   . Panic attacks   . Depression   . BV (bacterial vaginosis)     Past Surgical History  Procedure Laterality Date  . Induced abortion      History reviewed. No pertinent family history.  History  Substance Use Topics  . Smoking status: Former Smoker -- 1.00 packs/day    Types: Cigarettes    Quit date: 07/26/2013  . Smokeless tobacco: Not on file  . Alcohol Use: No    Allergies: No Known Allergies  Prescriptions prior to admission  Medication Sig Dispense Refill  . amphetamine-dextroamphetamine (ADDERALL) 20 MG  tablet Take 10 mg by mouth 2 (two) times daily.       . Aspirin-Salicylamide-Caffeine (BC HEADACHE POWDER PO) Take 1 each by mouth daily as needed (heafdache).      . citalopram (CELEXA) 40 MG tablet Take 40 mg by mouth daily.      Marland Kitchen LORazepam (ATIVAN) 1 MG tablet Take 1 mg by mouth every 6 (six) hours as needed for anxiety.       Results for orders placed during the hospital encounter of 08/26/13 (from the past 24 hour(s))  URINALYSIS, ROUTINE W REFLEX MICROSCOPIC     Status: Abnormal   Collection Time    08/26/13  4:30 PM      Result Value Range   Color, Urine YELLOW  YELLOW   APPearance CLEAR  CLEAR   Specific Gravity, Urine 1.020  1.005 - 1.030   pH 7.0  5.0 - 8.0   Glucose, UA NEGATIVE  NEGATIVE mg/dL   Hgb urine dipstick TRACE (*) NEGATIVE   Bilirubin Urine NEGATIVE  NEGATIVE   Ketones, ur NEGATIVE  NEGATIVE mg/dL   Protein, ur NEGATIVE  NEGATIVE mg/dL   Urobilinogen, UA 1.0  0.0 - 1.0 mg/dL   Nitrite NEGATIVE  NEGATIVE   Leukocytes, UA NEGATIVE  NEGATIVE  URINE MICROSCOPIC-ADD ON  Status: Abnormal   Collection Time    08/26/13  4:30 PM      Result Value Range   Squamous Epithelial / LPF FEW (*) RARE   WBC, UA 0-2  <3 WBC/hpf   RBC / HPF 3-6  <3 RBC/hpf   Bacteria, UA MANY (*) RARE   Crystals CA OXALATE CRYSTALS (*) NEGATIVE  POCT PREGNANCY, URINE     Status: Abnormal   Collection Time    08/26/13  4:43 PM      Result Value Range   Preg Test, Ur POSITIVE (*) NEGATIVE  WET PREP, GENITAL     Status: Abnormal   Collection Time    08/26/13  5:50 PM      Result Value Range   Yeast Wet Prep HPF POC NONE SEEN  NONE SEEN   Trich, Wet Prep NONE SEEN  NONE SEEN   Clue Cells Wet Prep HPF POC NONE SEEN  NONE SEEN   WBC, Wet Prep HPF POC FEW (*) NONE SEEN  GLUCOSE, CAPILLARY     Status: None   Collection Time    08/26/13  6:02 PM      Result Value Range   Glucose-Capillary 90  70 - 99 mg/dL  CBC     Status: None   Collection Time    08/26/13  6:19 PM      Result Value  Range   WBC 7.9  4.0 - 10.5 K/uL   RBC 4.33  3.87 - 5.11 MIL/uL   Hemoglobin 12.1  12.0 - 15.0 g/dL   HCT 29.5  62.1 - 30.8 %   MCV 84.5  78.0 - 100.0 fL   MCH 27.9  26.0 - 34.0 pg   MCHC 33.1  30.0 - 36.0 g/dL   RDW 65.7  84.6 - 96.2 %   Platelets 222  150 - 400 K/uL  ABO/RH     Status: None   Collection Time    08/26/13  6:19 PM      Result Value Range   ABO/RH(D) A POS    HCG, QUANTITATIVE, PREGNANCY     Status: Abnormal   Collection Time    08/26/13  6:19 PM      Result Value Range   hCG, Beta Chain, Quant, S 6194 (*) <5 mIU/mL   US Ob Comp Less 14 Wks  08/26/2013   CLINICAL DATA:  Pelvic pain.  EXAM: OBSTETRIC <14 WK [place] AND TRANSVAGINAL OB US  TECHNIQUE: Both transabdominal and transvaginal ultrasound examinations were performed for complete evaluation of the gestation as well as the maternal uterus, adnexal regions, and pelvic cul-de-sac. Transvaginal technique was performed to assess early pregnancy.  COMPARISON:  None.  FINDINGS: Intrauterine gestational sac: Visualized/normal in shape.  Yolk sac:  Present  Embryo:  Absent  Cardiac Activity: N/A  MSD:  8.3  mm   5 w   3  d  [place] EDC: 04/25/2014  Maternal uterus/adnexae: Large subchorionic hemorrhage surrounding approximately half the gestational sac. Right corpus luteum cyst. Ovaries are symmetric in size and echotexture. No adnexal masses or free fluid.  IMPRESSION: Five week 3 day intrauterine pregnancy. No fetal pole currently. Large subchorionic hemorrhage.   Electronically Signed   By: Charlett Nose M.D.   On: 08/26/2013 18:52   US Ob Transvaginal  08/26/2013   CLINICAL DATA:  Pelvic pain.  EXAM: OBSTETRIC <14 WK [place] AND TRANSVAGINAL OB US  TECHNIQUE: Both transabdominal and transvaginal ultrasound examinations were performed for complete evaluation of the gestation  as well as the maternal uterus, adnexal regions, and pelvic cul-de-sac. Transvaginal technique was performed to assess early pregnancy.  COMPARISON:  None.  FINDINGS:  Intrauterine gestational sac: Visualized/normal in shape.  Yolk sac:  Present  Embryo:  Absent  Cardiac Activity: N/A  MSD:  8.3  mm   5 w   3  d  [place] EDC: 04/25/2014  Maternal uterus/adnexae: Large subchorionic hemorrhage surrounding approximately half the gestational sac. Right corpus luteum cyst. Ovaries are symmetric in size and echotexture. No adnexal masses or free fluid.  IMPRESSION: Five week 3 day intrauterine pregnancy. No fetal pole currently. Large subchorionic hemorrhage.   Electronically Signed   By: Charlett Nose M.D.   On: 08/26/2013 18:52    Review of Systems  Constitutional: Negative for fever and chills.  Gastrointestinal: Positive for abdominal pain. Negative for nausea and vomiting.       Left lower abdominal pain   Genitourinary: Negative for dysuria and urgency.  Neurological: Positive for dizziness, weakness and headaches.  Psychiatric/Behavioral: Negative for depression.   Physical Exam   Blood pressure 115/74, pulse 90, temperature 98.3 F (36.8 C), temperature source Oral, resp. rate 18, height 5\' 6"  (1.676 m), weight 71.668 kg (158 lb), last menstrual period 06/02/2013.  Physical Exam  Constitutional: She is oriented to person, place, and time. She appears well-developed and well-nourished.  HENT:  Head: Normocephalic.  Neck: Neck supple.  Respiratory: Effort normal.  GI: Soft. She exhibits no distension. There is tenderness. There is no rebound and no guarding.  Left, lower quadrant tenderness   Genitourinary: No vaginal discharge found.  Speculum exam: Vagina - Small amount of creamy discharge, no odor Cervix - No contact bleeding Bimanual exam: Cervix closed Uterus non tender, normal size; gravid Adnexa non tender, no masses bilaterally GC/Chlam, wet prep done Chaperone present for exam.   Neurological: She is alert and oriented to person, place, and time.  Skin: Skin is warm. She is not diaphoretic.    MAU Course   Procedures  MDM  UA UPT CBC Beta Hcg ABO/RH Wet prep GC/Chlamydia- pending   [place]   Consulted with Dr. Dareen Piano at [phone]; plan of care discussed. Pt to keep her scheduled appointment for Tuesday O -Positive blood type  Assessment and Plan  A: IUP; US done on 07/26/13 Threatened Miscarriage Large subchorionic hemorrhage    P: Discharge home Pelvic rest discussed  First trimester precautions discussed Bleeding precautions discussed  Return to MAU if symptoms worsen  Keep your appointment scheduled with Dr. Dareen Piano on Tuesday 08/29/2013  RASCH, JENNIFER IRENE FNP-C 08/26/2013, 7:29 PM

## 2013-08-26 NOTE — MAU Note (Signed)
Patient is a 22yo G3P1 presenting to [facility]U with c/o dizziness and head feeling numb x 2 days, LLQ constant cramping x 3-4 days ago. Denies VB.  Medications within 24 hrs: Adderall 15mg  at 0745. Patient reports she has not taken citalopram in last 4 days and is prescribed 40mg  daily.

## 2013-08-26 NOTE — MAU Note (Signed)
Pt presents with complaints of lower abdominal pain for 3 days. States she is having dizziness and severe headache. + HPT at home

## 2013-08-27 LAB — GC/CHLAMYDIA PROBE AMP
CT Probe RNA: NEGATIVE
GC Probe RNA: NEGATIVE

## 2013-08-28 ENCOUNTER — Encounter (HOSPITAL_COMMUNITY): Payer: Self-pay | Admitting: *Deleted

## 2013-08-28 ENCOUNTER — Inpatient Hospital Stay (HOSPITAL_COMMUNITY): Payer: BC Managed Care – PPO

## 2013-08-28 ENCOUNTER — Inpatient Hospital Stay (HOSPITAL_COMMUNITY)
Admission: AD | Admit: 2013-08-28 | Discharge: 2013-08-29 | Disposition: A | Discharge status: Home / Self Care | Payer: BC Managed Care – PPO | Source: Ambulatory Visit | Attending: Obstetrics and Gynecology | Admitting: Obstetrics and Gynecology

## 2013-08-28 DIAGNOSIS — R109 Unspecified abdominal pain: Secondary | ICD-10-CM | POA: Insufficient documentation

## 2013-08-28 DIAGNOSIS — F909 Attention-deficit hyperactivity disorder, unspecified type: Secondary | ICD-10-CM | POA: Insufficient documentation

## 2013-08-28 DIAGNOSIS — O418X10 Other specified disorders of amniotic fluid and membranes, first trimester, not applicable or unspecified: Secondary | ICD-10-CM

## 2013-08-28 DIAGNOSIS — R42 Dizziness and giddiness: Secondary | ICD-10-CM | POA: Insufficient documentation

## 2013-08-28 DIAGNOSIS — O99891 Other specified diseases and conditions complicating pregnancy: Secondary | ICD-10-CM | POA: Insufficient documentation

## 2013-08-28 DIAGNOSIS — O26899 Other specified pregnancy related conditions, unspecified trimester: Secondary | ICD-10-CM

## 2013-08-28 DIAGNOSIS — R5381 Other malaise: Secondary | ICD-10-CM | POA: Insufficient documentation

## 2013-08-28 DIAGNOSIS — R5383 Other fatigue: Secondary | ICD-10-CM | POA: Insufficient documentation

## 2013-08-28 DIAGNOSIS — O209 Hemorrhage in early pregnancy, unspecified: Secondary | ICD-10-CM | POA: Insufficient documentation

## 2013-08-28 LAB — COMPREHENSIVE METABOLIC PANEL
ALT: 7 U/L (ref 0–35)
Alkaline Phosphatase: 51 U/L (ref 39–117)
CO2: 28 mEq/L (ref 19–32)
GFR calc Af Amer: >90 mL/min (ref >90)
GFR calc non Af Amer: >90 mL/min (ref >90)
Glucose, Bld: 96 mg/dL (ref 70–99)
Potassium: 3.9 mEq/L (ref 3.5–5.1)
Sodium: 138 mEq/L (ref 135–145)

## 2013-08-28 LAB — URINALYSIS, ROUTINE W REFLEX MICROSCOPIC
Glucose, UA: NEGATIVE mg/dL
Hgb urine dipstick: NEGATIVE
Protein, ur: NEGATIVE mg/dL
Specific Gravity, Urine: 1.02 (ref 1.005–1.030)

## 2013-08-28 MED ORDER — OXYCODONE-ACETAMINOPHEN 5-325 MG PO TABS
2.0000 | ORAL_TABLET | Freq: Once | ORAL | Status: AC
  Administered 2013-08-28: 2 via ORAL
  Filled 2013-08-28: qty 2

## 2013-08-28 MED ORDER — ONDANSETRON 8 MG PO TBDP
8.0000 mg | ORAL_TABLET | Freq: Once | ORAL | Status: AC
  Administered 2013-08-28: 8 mg via ORAL
  Filled 2013-08-28: qty 1

## 2013-08-28 NOTE — MAU Note (Signed)
Pt states she is having heart palpitations. Does not know if it is related to anxiety. Melissa Levine CNM notified

## 2013-08-28 NOTE — MAU Note (Signed)
PT SAYS SHE WAS HERE  ON SAT- LOWER  ABD PAIN-   U/S-  SAW  SCH-    NO BLEEDING.       AT 930PM-  SHE LAYED  SHE STARTED CRAMPING BAD-  DID NOT CALL DR.  NO MED FOR PAIN.  NO BLEEDING.    LAST SEX- MTH.

## 2013-08-29 DIAGNOSIS — R109 Unspecified abdominal pain: Secondary | ICD-10-CM

## 2013-08-29 DIAGNOSIS — O9989 Other specified diseases and conditions complicating pregnancy, childbirth and the puerperium: Secondary | ICD-10-CM

## 2013-08-29 MED ORDER — OXYCODONE-ACETAMINOPHEN 5-325 MG PO TABS: 1.0000 | ORAL_TABLET | ORAL | Status: DC | PRN

## 2013-08-29 NOTE — MAU Provider Note (Signed)
Chief Complaint: No chief complaint on file.  First Provider Initiated Contact with Patient 08/29/13 0055     SUBJECTIVE HPI: Melissa Levine is a 22 y.o. G3P1011 at [redacted]w[redacted]d by LMP who presents with worsening low abdominal pain. Seen in MAU 13 2014 for same and in. Ultrasound showed yolk sac and large subchorionic hemorrhage. Patient has not had any bleeding. Also reports some episodes of feeling and dizzy, weak and having one episode of tingling lips. Her admits to "forgetting to eat" up to 3 days at a time and only drinking coffee on this days. On Adderall for ADHD. 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 Last intercourse one month ago.  Past Medical History  Diagnosis Date  . ADHD (attention deficit hyperactivity disorder)   . Panic attacks   . Depression   . BV (bacterial vaginosis)    OB History  Gravida Para Term Preterm AB SAB TAB Ectopic Multiple Living  3 1 1  0 1 0 1 0 0 1    # Outcome Date GA Lbr Len/2nd Weight Sex Delivery Anes PTL Lv  3 CUR           2 TRM      SVD     1 TAB              Past Surgical History  Procedure Laterality Date  . Induced abortion     History   Social History  . Marital Status: Single    Spouse Name: N/A    Number of Children: N/A  . Years of Education: N/A   Occupational History  . Not on file.   Social History Main Topics  . Smoking status: Former Smoker -- 1.00 packs/day    Types: Cigarettes    Quit date: 07/26/2013  . Smokeless tobacco: Not on file  . Alcohol Use: No  . Drug Use: No  . Sexual Activity: Yes    Birth Control/ Protection: Condom   Other Topics Concern  . Not on file   Social History Narrative  . No narrative on file   No current facility-administered medications on file prior to encounter.   Current Outpatient Prescriptions on File Prior to Encounter  Medication Sig Dispense Refill  . citalopram (CELEXA) 40 MG tablet Take 40 mg by mouth  daily.       No Known Allergies  ROS: Pertinent positives in history of present illness. Negative for fever, chills, nausea, vomiting, heartburn, diarrhea, constipation, urinary complaints, flank pain, vaginal bleeding, vaginal discharge, dyspareunia.  OBJECTIVE Blood pressure 129/89, pulse 98, temperature 98 F (36.7 C), temperature source Oral, resp. rate 20, height 5\' 6"  (1.676 m), weight 73.029 kg (161 lb), last menstrual period 06/02/2013. GENERAL: Well-developed, well-nourished female in moderate distress.  HEENT: Normocephalic HEART: normal rate RESP: normal effort ABDOMEN: Soft, non-tender EXTREMITIES: Nontender, no edema NEURO: Alert and oriented SPECULUM EXAM: Deferred due to recent exam.  LAB RESULTS Results for orders placed during the hospital encounter of 08/28/13 (from the past 24 hour(s))  URINALYSIS, ROUTINE W REFLEX MICROSCOPIC     Status: Abnormal   Collection Time    08/28/13 10:30 PM      Result Value Range   Color, Urine YELLOW  YELLOW   APPearance TURBID (*) CLEAR   Specific Gravity, Urine 1.020  1.005 - 1.030   pH 8.0  5.0 - 8.0   Glucose, UA NEGATIVE  NEGATIVE mg/dL   Hgb urine  dipstick NEGATIVE  NEGATIVE   Bilirubin Urine NEGATIVE  NEGATIVE   Ketones, ur NEGATIVE  NEGATIVE mg/dL   Protein, ur NEGATIVE  NEGATIVE mg/dL   Urobilinogen, UA 0.2  0.0 - 1.0 mg/dL   Nitrite NEGATIVE  NEGATIVE   Leukocytes, UA NEGATIVE  NEGATIVE  COMPREHENSIVE METABOLIC PANEL     Status: Abnormal   Collection Time    08/28/13 10:30 PM      Result Value Range   Sodium 138  135 - 145 mEq/L   Potassium 3.9  3.5 - 5.1 mEq/L   Chloride 100  96 - 112 mEq/L   CO2 28  19 - 32 mEq/L   Glucose, Bld 96  70 - 99 mg/dL   BUN 10  6 - 23 mg/dL   Creatinine, Ser 1.61 (*) 0.50 - 1.10 mg/dL   Calcium 9.9  8.4 - 09.6 mg/dL   Total Protein 7.2  6.0 - 8.3 g/dL   Albumin 4.3  3.5 - 5.2 g/dL   AST 14  0 - 37 U/L   ALT 7  0 - 35 U/L   Alkaline Phosphatase 51  39 - 117 U/L   Total  Bilirubin 0.1 (*) 0.3 - 1.2 mg/dL   GFR calc non Af Amer >90  >90 mL/min   GFR calc Af Amer >90  >90 mL/min  URINE MICROSCOPIC-ADD ON     Status: None   Collection Time    08/28/13 10:30 PM      Result Value Range   Squamous Epithelial / LPF RARE  RARE   WBC, UA 0-2  <3 WBC/hpf   RBC / HPF 0-2  <3 RBC/hpf   Bacteria, UA RARE  RARE   Urine-Other AMORPHOUS URATES/PHOSPHATES      IMAGING US Ob Comp Less 14 Wks  08/26/2013   CLINICAL DATA:  Pelvic pain.  EXAM: OBSTETRIC <14 WK [place] AND TRANSVAGINAL OB US  TECHNIQUE: Both transabdominal and transvaginal ultrasound examinations were performed for complete evaluation of the gestation as well as the maternal uterus, adnexal regions, and pelvic cul-de-sac. Transvaginal technique was performed to assess early pregnancy.  COMPARISON:  None.  FINDINGS: Intrauterine gestational sac: Visualized/normal in shape.  Yolk sac:  Present  Embryo:  Absent  Cardiac Activity: N/A  MSD:  8.3  mm   5 w   3  d  [place] EDC: 04/25/2014  Maternal uterus/adnexae: Large subchorionic hemorrhage surrounding approximately half the gestational sac. Right corpus luteum cyst. Ovaries are symmetric in size and echotexture. No adnexal masses or free fluid.  IMPRESSION: Five week 3 day intrauterine pregnancy. No fetal pole currently. Large subchorionic hemorrhage.   Electronically Signed   By: Charlett Nose M.D.   On: 08/26/2013 18:52   US Ob Transvaginal  08/28/2013   CLINICAL DATA:  Severe cramping. Patient has a confirmed intrauterine pregnancy.  EXAM: TRANSVAGINAL OB ULTRASOUND  TECHNIQUE: Transvaginal ultrasound was performed for complete evaluation of the gestation as well as the maternal uterus, adnexal regions, and pelvic cul-de-sac.  COMPARISON:  08/26/2013  FINDINGS: Intrauterine gestational sac: Visualized/normal in shape.  Yolk sac:  well-formed yolk sac is seen  Embryo:  Present  Cardiac Activity: Cardiac activity was identified  Heart Rate: 92 bpm  MSD:   mm    w     d  CRL:   2.6   mm   5 w 6 d                  [place] EDC: 04/24/2014  There is a hemorrhage bordering on the gestational sac and extends into the endometrial canal likely a combination of subchorionic hemorrhage, moderate size and endometrial canal hemorrhage.  Maternal uterus/adnexae: Right ovarian corpus luteum is noted. The ovaries are otherwise unremarkable. No adnexal masses and no pelvic free fluid.  IMPRESSION: Single live intrauterine pregnancy with a measures gestational age of [redacted] weeks and 6 days showing slight interval growth since the recent prior study.  There is a moderate size subchorionic hemorrhage extending into the endometrial canal. This is also without significant change from the prior study.  Ovaries and adnexa are unremarkable. No pelvic free fluid.   Electronically Signed   By: Amie Portland   On: 08/28/2013 23:41   US Ob Transvaginal  08/26/2013   CLINICAL DATA:  Pelvic pain.  EXAM: OBSTETRIC <14 WK [place] AND TRANSVAGINAL OB US  TECHNIQUE: Both transabdominal and transvaginal ultrasound examinations were performed for complete evaluation of the gestation as well as the maternal uterus, adnexal regions, and pelvic cul-de-sac. Transvaginal technique was performed to assess early pregnancy.  COMPARISON:  None.  FINDINGS: Intrauterine gestational sac: Visualized/normal in shape.  Yolk sac:  Present  Embryo:  Absent  Cardiac Activity: N/A  MSD:  8.3  mm   5 w   3  d  [place] EDC: 04/25/2014  Maternal uterus/adnexae: Large subchorionic hemorrhage surrounding approximately half the gestational sac. Right corpus luteum cyst. Ovaries are symmetric in size and echotexture. No adnexal masses or free fluid.  IMPRESSION: Five week 3 day intrauterine pregnancy. No fetal pole currently. Large subchorionic hemorrhage.   Electronically Signed   By: Charlett Nose M.D.   On: 08/26/2013 18:52   MAU COURSE Pain relieved with one Percocet.  ASSESSMENT 1. Abdominal pain complicating pregnancy, antepartum   2. Subchorionic hemorrhage  in first trimester    dizziness and weakness likely due to poor oral intake.  PLAN Discharge home in stable condition per consult with Dr. Tenny Craw. Lengthy discussion with patient about why she is not eating. Expressed concern that the Adderall may be suppressing her appetite too much and recommend that she talk to the prescriber about alternative management for ADHD symptoms. Encouraged patient to set alarm for meals and snacks and carry snacks and drinks with her to work. Needs to treat food and fluids has medicine in order to feel better and provide nutrients to her baby.  discussed increased risk of SAB with large subchorionic hemorrhage. SAB precautions.   Medication List         citalopram 40 MG tablet  Commonly known as:  CELEXA  Take 40 mg by mouth daily.     oxyCODONE-acetaminophen 5-325 MG per tablet  Commonly known as:  PERCOCET/ROXICET  Take 1-2 tablets by mouth every 4 (four) hours as needed for pain.       Follow-up Information   Follow up with Almon Hercules., MD In 1 week. (Or as needed if symptoms worsen)    Specialty:  Obstetrics and Gynecology   Contact information:   [address] [place] [address] [phone]       Follow up with [facility] [place] MATERNITY ADMISSIONS. (As needed if symptoms worsen)    Contact information:   [address] [id number] [place] [address] [phone]      Dorathy Kinsman, [place] 08/29/2013  12:50 AM

## 2013-10-12 LAB — OB RESULTS CONSOLE HEPATITIS B SURFACE ANTIGEN: Hepatitis B Surface Ag: NEGATIVE

## 2013-10-12 LAB — OB RESULTS CONSOLE RPR: RPR: NONREACTIVE

## 2013-10-12 LAB — OB RESULTS CONSOLE RUBELLA ANTIBODY, IGM: Rubella: IMMUNE

## 2013-10-12 LAB — OB RESULTS CONSOLE HIV ANTIBODY (ROUTINE TESTING): HIV: NONREACTIVE

## 2013-10-12 LAB — OB RESULTS CONSOLE ABO/RH: RH TYPE: POSITIVE

## 2013-10-12 LAB — OB RESULTS CONSOLE ANTIBODY SCREEN: Antibody Screen: NEGATIVE

## 2013-10-12 LAB — OB RESULTS CONSOLE GC/CHLAMYDIA
CHLAMYDIA, DNA PROBE: NEGATIVE
GC PROBE AMP, GENITAL: NEGATIVE

## 2013-10-19 ENCOUNTER — Other Ambulatory Visit: Payer: Self-pay

## 2013-12-14 NOTE — L&D Delivery Note (Signed)
Patient was C/C/+3 and baby delivered spontaneously in two ctxes with epidural.   NSVD  female infant, Apgars 8,9, weight P.   The patient had one first degree perineal lacerations repaired with 3-0 vicryl R. Fundus was firm. EBL was expected. Placenta was delivered intact. Vagina was clear.  Baby was vigorous and doing skin to skin with mother.  Melissa Levine

## 2014-02-07 ENCOUNTER — Inpatient Hospital Stay (HOSPITAL_COMMUNITY)
Admission: AD | Admit: 2014-02-07 | Discharge: 2014-02-07 | Disposition: A | Discharge status: Home / Self Care | Payer: Medicaid Other | Source: Ambulatory Visit | Attending: Obstetrics and Gynecology | Admitting: Obstetrics and Gynecology

## 2014-02-07 ENCOUNTER — Encounter (HOSPITAL_COMMUNITY): Payer: Self-pay | Admitting: General Practice

## 2014-02-07 DIAGNOSIS — O2650 Maternal hypotension syndrome, unspecified trimester: Secondary | ICD-10-CM | POA: Insufficient documentation

## 2014-02-07 DIAGNOSIS — O212 Late vomiting of pregnancy: Secondary | ICD-10-CM | POA: Insufficient documentation

## 2014-02-07 DIAGNOSIS — Z87891 Personal history of nicotine dependence: Secondary | ICD-10-CM | POA: Insufficient documentation

## 2014-02-07 DIAGNOSIS — R55 Syncope and collapse: Secondary | ICD-10-CM

## 2014-02-07 LAB — URINALYSIS, ROUTINE W REFLEX MICROSCOPIC
Bilirubin Urine: NEGATIVE
GLUCOSE, UA: NEGATIVE mg/dL
HGB URINE DIPSTICK: NEGATIVE
KETONES UR: NEGATIVE mg/dL
LEUKOCYTES UA: NEGATIVE
Nitrite: NEGATIVE
PH: 6 (ref 5.0–8.0)
Protein, ur: NEGATIVE mg/dL
Specific Gravity, Urine: <1.005 — ABNORMAL LOW (ref 1.005–1.030)
Urobilinogen, UA: 0.2 mg/dL (ref 0.0–1.0)

## 2014-02-07 LAB — COMPREHENSIVE METABOLIC PANEL
ALBUMIN: 2.7 g/dL — AB (ref 3.5–5.2)
ALK PHOS: 62 U/L (ref 39–117)
ALT: 7 U/L (ref 0–35)
AST: 12 U/L (ref 0–37)
BUN: 7 mg/dL (ref 6–23)
CHLORIDE: 99 meq/L (ref 96–112)
CO2: 23 mEq/L (ref 19–32)
Calcium: 8.4 mg/dL (ref 8.4–10.5)
Creatinine, Ser: 0.45 mg/dL — ABNORMAL LOW (ref 0.50–1.10)
GFR calc Af Amer: >90 mL/min (ref >90)
GFR calc non Af Amer: >90 mL/min (ref >90)
Glucose, Bld: 80 mg/dL (ref 70–99)
POTASSIUM: 4 meq/L (ref 3.7–5.3)
SODIUM: 133 meq/L — AB (ref 137–147)
TOTAL PROTEIN: 6.3 g/dL (ref 6.0–8.3)

## 2014-02-07 LAB — CBC
HEMATOCRIT: 31.2 % — AB (ref 36.0–46.0)
Hemoglobin: 10.4 g/dL — ABNORMAL LOW (ref 12.0–15.0)
MCH: 28.5 pg (ref 26.0–34.0)
MCHC: 33.3 g/dL (ref 30.0–36.0)
MCV: 85.5 fL (ref 78.0–100.0)
PLATELETS: 170 10*3/uL (ref 150–400)
RBC: 3.65 MIL/uL — ABNORMAL LOW (ref 3.87–5.11)
RDW: 12.7 % (ref 11.5–15.5)
WBC: 12.6 10*3/uL — AB (ref 4.0–10.5)

## 2014-02-07 MED ORDER — LACTATED RINGERS IV SOLN
INTRAVENOUS | Status: DC
  Administered 2014-02-07: 12:00:00 via INTRAVENOUS
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
## 2014-02-07 NOTE — MAU Note (Signed)
Pt presents to MAU via stretcher by EMS after patient passed out in her car. Pt subsequently had an episode of nausea and vomiting stating that she vomited on herself. Was brought in with a 20 gauge iv in her left AC that is infusing well NACL.  Pt upon arrival is A&O x3 without further complaints.

## 2014-02-07 NOTE — Discharge Instructions (Signed)
Syncope  Syncope is a fainting spell. This means the person loses consciousness and drops to the ground. The person is generally unconscious for less than 5 minutes. The person may have some muscle twitches for up to 15 seconds before waking up and returning to normal. Syncope occurs more often in elderly people, but it can happen to anyone. While most causes of syncope are not dangerous, syncope can be a sign of a serious medical problem. It is important to seek medical care.   CAUSES   Syncope is caused by a sudden decrease in blood flow to the brain. The specific cause is often not determined. Factors that can trigger syncope include:   Taking medicines that lower blood pressure.   Sudden changes in posture, such as standing up suddenly.   Taking more medicine than prescribed.   Standing in one place for too long.   Seizure disorders.   Dehydration and excessive exposure to heat.   Low blood sugar (hypoglycemia).   Straining to have a bowel movement.   Heart disease, irregular heartbeat, or other circulatory problems.   Fear, emotional distress, seeing blood, or severe pain.  SYMPTOMS   Right before fainting, you may:   Feel dizzy or lightheaded.   Feel nauseous.   See all white or all black in your field of vision.   Have cold, clammy skin.  DIAGNOSIS   Your caregiver will ask about your symptoms, perform a physical exam, and perform electrocardiography (ECG) to record the electrical activity of your heart. Your caregiver may also perform other heart or blood tests to determine the cause of your syncope.  TREATMENT   In most cases, no treatment is needed. Depending on the cause of your syncope, your caregiver may recommend changing or stopping some of your medicines.  HOME CARE INSTRUCTIONS   Have someone stay with you until you feel stable.   Do not drive, operate machinery, or play sports until your caregiver says it is okay.   Keep all follow-up appointments as directed by your  caregiver.   Lie down right away if you start feeling like you might faint. Breathe deeply and steadily. Wait until all the symptoms have passed.   Drink enough fluids to keep your urine clear or pale yellow.   If you are taking blood pressure or heart medicine, get up slowly, taking several minutes to sit and then stand. This can reduce dizziness.  SEEK IMMEDIATE MEDICAL CARE IF:    You have a severe headache.   You have unusual pain in the chest, abdomen, or back.   You are bleeding from the mouth or rectum, or you have black or tarry stool.   You have an irregular or very fast heartbeat.   You have pain with breathing.   You have repeated fainting or seizure-like jerking during an episode.   You faint when sitting or lying down.   You have confusion.   You have difficulty walking.   You have severe weakness.   You have vision problems.  If you fainted, call your local emergency services (911 in [place] Do not drive yourself to the hospital.   MAKE SURE YOU:   Understand these instructions.   Will watch your condition.   Will get help right away if you are not doing well or get worse.  Document Released: 11/30/2005 Document Revised: 05/31/2012 Document Reviewed: 01/29/2012  [employer] Patient Information 2014 [employer].

## 2014-03-31 LAB — OB RESULTS CONSOLE GBS: GBS: NEGATIVE

## 2014-04-15 ENCOUNTER — Encounter (HOSPITAL_COMMUNITY): Payer: Self-pay | Admitting: *Deleted

## 2014-04-15 ENCOUNTER — Inpatient Hospital Stay (HOSPITAL_COMMUNITY)
Admission: AD | Admit: 2014-04-15 | Discharge: 2014-04-15 | Disposition: A | Discharge status: Home / Self Care | Payer: Medicaid Other | Source: Ambulatory Visit | Attending: Obstetrics and Gynecology | Admitting: Obstetrics and Gynecology

## 2014-04-15 DIAGNOSIS — O9989 Other specified diseases and conditions complicating pregnancy, childbirth and the puerperium: Secondary | ICD-10-CM

## 2014-04-15 DIAGNOSIS — O26899 Other specified pregnancy related conditions, unspecified trimester: Secondary | ICD-10-CM

## 2014-04-15 DIAGNOSIS — Z3689 Encounter for other specified antenatal screening: Secondary | ICD-10-CM

## 2014-04-15 DIAGNOSIS — R109 Unspecified abdominal pain: Secondary | ICD-10-CM | POA: Insufficient documentation

## 2014-04-15 DIAGNOSIS — O368190 Decreased fetal movements, unspecified trimester, not applicable or unspecified: Secondary | ICD-10-CM | POA: Insufficient documentation

## 2014-04-15 DIAGNOSIS — R1084 Generalized abdominal pain: Secondary | ICD-10-CM

## 2014-04-15 DIAGNOSIS — Z87891 Personal history of nicotine dependence: Secondary | ICD-10-CM | POA: Insufficient documentation

## 2014-04-15 HISTORY — DX: Anemia, unspecified: D64.9

## 2014-04-15 LAB — URINALYSIS, ROUTINE W REFLEX MICROSCOPIC
Bilirubin Urine: NEGATIVE
Glucose, UA: NEGATIVE mg/dL
Hgb urine dipstick: NEGATIVE
Ketones, ur: NEGATIVE mg/dL
Leukocytes, UA: NEGATIVE
Nitrite: NEGATIVE
Protein, ur: NEGATIVE mg/dL
Specific Gravity, Urine: <1.005 — ABNORMAL LOW (ref 1.005–1.030)
Urobilinogen, UA: 0.2 mg/dL (ref 0.0–1.0)
pH: 6 (ref 5.0–8.0)

## 2014-04-15 NOTE — Discharge Instructions (Signed)
Abdominal Pain During Pregnancy °Abdominal pain is common in pregnancy. Most of the time, it does not cause harm. There are many causes of abdominal pain. Some causes are more serious than others. Some of the causes of abdominal pain in pregnancy are easily diagnosed. Occasionally, the diagnosis takes time to understand. Other times, the cause is not determined. Abdominal pain can be a sign that something is very wrong with the pregnancy, or the pain may have nothing to do with the pregnancy at all. For this reason, always tell your health care provider if you have any abdominal discomfort. °HOME CARE INSTRUCTIONS  °Monitor your abdominal pain for any changes. The following actions may help to alleviate any discomfort you are experiencing: °· Do not have sexual intercourse or put anything in your vagina until your symptoms go away completely. °· Get plenty of rest until your pain improves. °· Drink clear fluids if you feel nauseous. Avoid solid food as long as you are uncomfortable or nauseous. °· Only take over-the-counter or prescription medicine as directed by your health care provider. °· Keep all follow-up appointments with your health care provider. °SEEK IMMEDIATE MEDICAL CARE IF: °· You are bleeding, leaking fluid, or passing tissue from the vagina. °· You have increasing pain or cramping. °· You have persistent vomiting. °· You have painful or bloody urination. °· You have a fever. °· You notice a decrease in your baby's movements. °· You have extreme weakness or feel faint. °· You have shortness of breath, with or without abdominal pain. °· You develop a severe headache with abdominal pain. °· You have abnormal vaginal discharge with abdominal pain. °· You have persistent diarrhea. °· You have abdominal pain that continues even after rest, or gets worse. °MAKE SURE YOU:  °· Understand these instructions. °· Will watch your condition. °· Will get help right away if you are not doing well or get  worse. °Document Released: 11/30/2005 Document Revised: 09/20/2013 Document Reviewed: 06/29/2013 °[employer]® Patient Information ©2014 [employer]. ° °

## 2014-04-15 NOTE — MAU Note (Signed)
Pt presents with complaints of abdominal pain that is constant for 2 days. Decrease in fetal movement x 2 days.

## 2014-04-15 NOTE — MAU Note (Signed)
Pt says pain goes from lower abd/pelvis to mid lateral abd.  Denies any bleeding just mucous plug . Has felt movement since place in bed in MAU.

## 2014-04-15 NOTE — MAU Provider Note (Signed)
  History     CSN: [id number]  Arrival date and time: 04/15/14 1526   First Provider Initiated Contact with Patient 04/15/14 1615      Chief Complaint  Patient presents with  . Abdominal Pain  . Decreased Fetal Movement   HPI Melissa Levine is a 23 y.o. G3P1011 at 3250w4d. She presents with 2 d hx of abd pressure and decreased fetal movement. The pressure can be ML from fundus to symphsis, on the sides or low ML.  No cramping, no bleeding or leaking. She lost her mucous plug last week. The baby is usually always moving, past 2 days can go a few hours between movement.  The baby has been very active since pt placed on the monitor.   OB History   Grav Para Term Preterm Abortions TAB SAB Ect Mult Living   3 1 1  0 1 1 0 0 0 1      Past Medical History  Diagnosis Date  . ADHD (attention deficit hyperactivity disorder)   . Panic attacks   . Depression   . BV (bacterial vaginosis)   . Anemia     Past Surgical History  Procedure Laterality Date  . Induced abortion      History reviewed. No pertinent family history.  History  Substance Use Topics  . Smoking status: Former Smoker -- 1.00 packs/day    Types: Cigarettes    Quit date: 07/26/2013  . Smokeless tobacco: Not on file  . Alcohol Use: No    Allergies: No Known Allergies  Prescriptions prior to admission  Medication Sig Dispense Refill  . acetaminophen (TYLENOL) 325 MG tablet Take 325 mg by mouth every 6 (six) hours as needed for mild pain.      . citalopram (CELEXA) 40 MG tablet Take 40 mg by mouth daily.      Marland Kitchen. docusate sodium (COLACE) 100 MG capsule Take 100 mg by mouth 2 (two) times daily.      . IRON PO Take 1 tablet by mouth 2 (two) times daily.        Review of Systems  Constitutional: Negative for fever and chills.  Gastrointestinal: Positive for abdominal pain. Negative for heartburn, nausea, vomiting, diarrhea and constipation.  Genitourinary: Negative for dysuria, urgency and frequency.   Physical  Exam   Blood pressure 114/57, pulse 80, temperature 98.1 F (36.7 C), temperature source Oral, resp. rate 16, height 5\' 5"  (1.651 m), weight 221 lb (100.245 kg), last menstrual period 06/02/2013.  Physical Exam  Constitutional: She is oriented to person, place, and time. She appears well-developed and well-nourished.  GI: Soft. She exhibits distension. There is no tenderness. There is no rebound and no guarding.  Musculoskeletal: Normal range of motion.  Neurological: She is alert and oriented to person, place, and time.  Skin: Skin is warm and dry.  Psychiatric: She has a normal mood and affect.    MAU Course  Procedures  MDM    Assessment and Plan  38 4/7 wks  Reactive strip Cervix unchanged, urine neg  Precautions reviewed Keep PNV on 5/5 Dr Dareen PianoAnderson consulted  [name] 04/15/2014, 4:24 PM

## 2014-04-18 ENCOUNTER — Telehealth (HOSPITAL_COMMUNITY): Payer: Self-pay | Admitting: *Deleted

## 2014-04-18 NOTE — Telephone Encounter (Signed)
Preadmission screen  

## 2014-04-19 ENCOUNTER — Encounter (HOSPITAL_COMMUNITY): Payer: Self-pay | Admitting: *Deleted

## 2014-04-20 ENCOUNTER — Inpatient Hospital Stay (HOSPITAL_COMMUNITY)
Admission: RE | Admit: 2014-04-20 | Discharge: 2014-04-21 | DRG: 775 | Disposition: A | Discharge status: Home / Self Care | Payer: Medicaid Other | Source: Ambulatory Visit | Attending: Obstetrics and Gynecology | Admitting: Obstetrics and Gynecology

## 2014-04-20 ENCOUNTER — Encounter (HOSPITAL_COMMUNITY): Payer: Self-pay

## 2014-04-20 ENCOUNTER — Encounter (HOSPITAL_COMMUNITY): Payer: Medicaid Other | Admitting: Anesthesiology

## 2014-04-20 ENCOUNTER — Inpatient Hospital Stay (HOSPITAL_COMMUNITY): Payer: Medicaid Other | Admitting: Anesthesiology

## 2014-04-20 DIAGNOSIS — Z87891 Personal history of nicotine dependence: Secondary | ICD-10-CM

## 2014-04-20 DIAGNOSIS — F329 Major depressive disorder, single episode, unspecified: Secondary | ICD-10-CM | POA: Diagnosis present

## 2014-04-20 DIAGNOSIS — O99344 Other mental disorders complicating childbirth: Secondary | ICD-10-CM | POA: Diagnosis present

## 2014-04-20 DIAGNOSIS — Z3490 Encounter for supervision of normal pregnancy, unspecified, unspecified trimester: Secondary | ICD-10-CM

## 2014-04-20 DIAGNOSIS — F3289 Other specified depressive episodes: Secondary | ICD-10-CM | POA: Diagnosis present

## 2014-04-20 DIAGNOSIS — F909 Attention-deficit hyperactivity disorder, unspecified type: Secondary | ICD-10-CM | POA: Diagnosis present

## 2014-04-20 DIAGNOSIS — O9902 Anemia complicating childbirth: Secondary | ICD-10-CM | POA: Diagnosis present

## 2014-04-20 DIAGNOSIS — D649 Anemia, unspecified: Secondary | ICD-10-CM | POA: Diagnosis present

## 2014-04-20 LAB — TYPE AND SCREEN
ABO/RH(D): A POS
ANTIBODY SCREEN: NEGATIVE

## 2014-04-20 LAB — CBC
HEMATOCRIT: 37.5 % (ref 36.0–46.0)
Hemoglobin: 12.7 g/dL (ref 12.0–15.0)
MCH: 30.1 pg (ref 26.0–34.0)
MCHC: 33.9 g/dL (ref 30.0–36.0)
MCV: 88.9 fL (ref 78.0–100.0)
Platelets: 185 10*3/uL (ref 150–400)
RBC: 4.22 MIL/uL (ref 3.87–5.11)
RDW: 15.6 % — ABNORMAL HIGH (ref 11.5–15.5)
WBC: 13 10*3/uL — ABNORMAL HIGH (ref 4.0–10.5)

## 2014-04-20 LAB — RPR

## 2014-04-20 MED ORDER — LIDOCAINE HCL (PF) 1 % IJ SOLN
30.0000 mL | INTRAMUSCULAR | Status: DC | PRN
  Filled 2014-04-20: qty 30

## 2014-04-20 MED ORDER — MEASLES, MUMPS & RUBELLA VAC ~~LOC~~ INJ: 0.5000 mL | INJECTION | Freq: Once | SUBCUTANEOUS | Status: DC

## 2014-04-20 MED ORDER — TETANUS-DIPHTH-ACELL PERTUSSIS 5-2.5-18.5 LF-MCG/0.5 IM SUSP
0.5000 mL | Freq: Once | INTRAMUSCULAR | Status: AC
  Administered 2014-04-21: 0.5 mL via INTRAMUSCULAR
  Filled 2014-04-20: qty 0.5

## 2014-04-20 MED ORDER — SIMETHICONE 80 MG PO CHEW: 80.0000 mg | CHEWABLE_TABLET | ORAL | Status: DC | PRN

## 2014-04-20 MED ORDER — LACTATED RINGERS IV SOLN
INTRAVENOUS | Status: DC
  Administered 2014-04-20 (×2): via INTRAVENOUS

## 2014-04-20 MED ORDER — LACTATED RINGERS IV SOLN: 500.0000 mL | INTRAVENOUS | Status: DC | PRN

## 2014-04-20 MED ORDER — DIPHENHYDRAMINE HCL 50 MG/ML IJ SOLN: 12.5000 mg | INTRAMUSCULAR | Status: DC | PRN

## 2014-04-20 MED ORDER — METHYLERGONOVINE MALEATE 0.2 MG PO TABS: 0.2000 mg | ORAL_TABLET | ORAL | Status: DC | PRN

## 2014-04-20 MED ORDER — MAGNESIUM HYDROXIDE 400 MG/5ML PO SUSP: 30.0000 mL | ORAL | Status: DC | PRN

## 2014-04-20 MED ORDER — ZOLPIDEM TARTRATE 5 MG PO TABS: 5.0000 mg | ORAL_TABLET | Freq: Every evening | ORAL | Status: DC | PRN

## 2014-04-20 MED ORDER — ONDANSETRON HCL 4 MG/2ML IJ SOLN
4.0000 mg | Freq: Four times a day (QID) | INTRAMUSCULAR | Status: DC | PRN
  Administered 2014-04-20: 4 mg via INTRAVENOUS
  Filled 2014-04-20: qty 2

## 2014-04-20 MED ORDER — CITRIC ACID-SODIUM CITRATE 334-500 MG/5ML PO SOLN: 30.0000 mL | ORAL | Status: DC | PRN

## 2014-04-20 MED ORDER — WITCH HAZEL-GLYCERIN EX PADS: 1.0000 "application " | MEDICATED_PAD | CUTANEOUS | Status: DC | PRN

## 2014-04-20 MED ORDER — IBUPROFEN 800 MG PO TABS
800.0000 mg | ORAL_TABLET | Freq: Three times a day (TID) | ORAL | Status: DC
  Administered 2014-04-20 – 2014-04-21 (×3): 800 mg via ORAL
  Filled 2014-04-20 (×3): qty 1

## 2014-04-20 MED ORDER — SENNOSIDES-DOCUSATE SODIUM 8.6-50 MG PO TABS
2.0000 | ORAL_TABLET | ORAL | Status: DC
  Administered 2014-04-20: 2 via ORAL
  Filled 2014-04-20: qty 2

## 2014-04-20 MED ORDER — FENTANYL 2.5 MCG/ML BUPIVACAINE 1/10 % EPIDURAL INFUSION (WH - ANES)
INTRAMUSCULAR | Status: DC | PRN
Start: 2014-04-20 — End: 2014-04-22
  Administered 2014-04-20: 14 mL/h via EPIDURAL

## 2014-04-20 MED ORDER — EPHEDRINE 5 MG/ML INJ
10.0000 mg | INTRAVENOUS | Status: DC | PRN
  Filled 2014-04-20: qty 2

## 2014-04-20 MED ORDER — FERROUS SULFATE 325 (65 FE) MG PO TABS
325.0000 mg | ORAL_TABLET | Freq: Two times a day (BID) | ORAL | Status: DC
  Administered 2014-04-21 (×2): 325 mg via ORAL
  Filled 2014-04-20 (×2): qty 1

## 2014-04-20 MED ORDER — ONDANSETRON HCL 4 MG/2ML IJ SOLN: 4.0000 mg | INTRAMUSCULAR | Status: DC | PRN

## 2014-04-20 MED ORDER — OXYCODONE-ACETAMINOPHEN 5-325 MG PO TABS: 1.0000 | ORAL_TABLET | ORAL | Status: DC | PRN

## 2014-04-20 MED ORDER — BENZOCAINE-MENTHOL 20-0.5 % EX AERO
1.0000 "application " | INHALATION_SPRAY | CUTANEOUS | Status: DC | PRN
  Administered 2014-04-20: 1 via TOPICAL
  Filled 2014-04-20: qty 56

## 2014-04-20 MED ORDER — SODIUM CHLORIDE 0.9 % IJ SOLN: 3.0000 mL | Freq: Two times a day (BID) | INTRAMUSCULAR | Status: DC

## 2014-04-20 MED ORDER — PHENYLEPHRINE 40 MCG/ML (10ML) SYRINGE FOR IV PUSH (FOR BLOOD PRESSURE SUPPORT)
80.0000 ug | PREFILLED_SYRINGE | INTRAVENOUS | Status: DC | PRN
  Filled 2014-04-20: qty 2

## 2014-04-20 MED ORDER — ACETAMINOPHEN 325 MG PO TABS: 650.0000 mg | ORAL_TABLET | ORAL | Status: DC | PRN

## 2014-04-20 MED ORDER — DIPHENHYDRAMINE HCL 25 MG PO CAPS: 25.0000 mg | ORAL_CAPSULE | Freq: Four times a day (QID) | ORAL | Status: DC | PRN

## 2014-04-20 MED ORDER — SODIUM CHLORIDE 0.9 % IJ SOLN: 3.0000 mL | INTRAMUSCULAR | Status: DC | PRN

## 2014-04-20 MED ORDER — LANOLIN HYDROUS EX OINT: TOPICAL_OINTMENT | CUTANEOUS | Status: DC | PRN

## 2014-04-20 MED ORDER — BUTORPHANOL TARTRATE 1 MG/ML IJ SOLN
1.0000 mg | INTRAMUSCULAR | Status: DC | PRN
  Administered 2014-04-20 (×2): 1 mg via INTRAVENOUS
  Filled 2014-04-20 (×2): qty 1

## 2014-04-20 MED ORDER — FLEET ENEMA 7-19 GM/118ML RE ENEM: 1.0000 | ENEMA | RECTAL | Status: DC | PRN

## 2014-04-20 MED ORDER — METHYLERGONOVINE MALEATE 0.2 MG/ML IJ SOLN: 0.2000 mg | INTRAMUSCULAR | Status: DC | PRN

## 2014-04-20 MED ORDER — EPHEDRINE 5 MG/ML INJ
INTRAVENOUS | Status: AC
  Filled 2014-04-20: qty 4

## 2014-04-20 MED ORDER — OXYTOCIN 40 UNITS IN LACTATED RINGERS INFUSION - SIMPLE MED
1.0000 m[IU]/min | INTRAVENOUS | Status: DC
  Administered 2014-04-20: 2 m[IU]/min via INTRAVENOUS
  Filled 2014-04-20: qty 1000

## 2014-04-20 MED ORDER — FENTANYL 2.5 MCG/ML BUPIVACAINE 1/10 % EPIDURAL INFUSION (WH - ANES)
14.0000 mL/h | INTRAMUSCULAR | Status: DC | PRN
  Administered 2014-04-20: 14 mL/h via EPIDURAL

## 2014-04-20 MED ORDER — IBUPROFEN 600 MG PO TABS: 600.0000 mg | ORAL_TABLET | Freq: Four times a day (QID) | ORAL | Status: DC | PRN

## 2014-04-20 MED ORDER — LIDOCAINE HCL (PF) 1 % IJ SOLN
INTRAMUSCULAR | Status: DC | PRN
  Administered 2014-04-20 (×2): 4 mL

## 2014-04-20 MED ORDER — FENTANYL 2.5 MCG/ML BUPIVACAINE 1/10 % EPIDURAL INFUSION (WH - ANES)
INTRAMUSCULAR | Status: AC
  Administered 2014-04-20: 14 mL/h via EPIDURAL
  Filled 2014-04-20: qty 125

## 2014-04-20 MED ORDER — CITALOPRAM HYDROBROMIDE 20 MG PO TABS
40.0000 mg | ORAL_TABLET | Freq: Every day | ORAL | Status: DC
  Administered 2014-04-21: 40 mg via ORAL
  Filled 2014-04-20 (×2): qty 2

## 2014-04-20 MED ORDER — LACTATED RINGERS IV SOLN: 500.0000 mL | Freq: Once | INTRAVENOUS | Status: DC

## 2014-04-20 MED ORDER — OXYTOCIN 40 UNITS IN LACTATED RINGERS INFUSION - SIMPLE MED: 62.5000 mL/h | INTRAVENOUS | Status: DC

## 2014-04-20 MED ORDER — DIBUCAINE 1 % RE OINT: 1.0000 "application " | TOPICAL_OINTMENT | RECTAL | Status: DC | PRN

## 2014-04-20 MED ORDER — SODIUM CHLORIDE 0.9 % IV SOLN: 250.0000 mL | INTRAVENOUS | Status: DC | PRN

## 2014-04-20 MED ORDER — PHENYLEPHRINE 40 MCG/ML (10ML) SYRINGE FOR IV PUSH (FOR BLOOD PRESSURE SUPPORT)
PREFILLED_SYRINGE | INTRAVENOUS | Status: AC
  Filled 2014-04-20: qty 10

## 2014-04-20 MED ORDER — OXYCODONE-ACETAMINOPHEN 5-325 MG PO TABS
1.0000 | ORAL_TABLET | ORAL | Status: DC | PRN
  Administered 2014-04-21: 1 via ORAL
  Filled 2014-04-20: qty 1

## 2014-04-20 MED ORDER — ONDANSETRON HCL 4 MG PO TABS: 4.0000 mg | ORAL_TABLET | ORAL | Status: DC | PRN

## 2014-04-20 MED ORDER — PRENATAL MULTIVITAMIN CH
1.0000 | ORAL_TABLET | Freq: Every day | ORAL | Status: DC
  Administered 2014-04-21: 1 via ORAL
  Filled 2014-04-20: qty 1

## 2014-04-20 MED ORDER — TERBUTALINE SULFATE 1 MG/ML IJ SOLN: 0.2500 mg | Freq: Once | INTRAMUSCULAR | Status: DC | PRN

## 2014-04-20 MED ORDER — OXYTOCIN BOLUS FROM INFUSION: 500.0000 mL | INTRAVENOUS | Status: DC

## 2014-04-20 NOTE — H&P (Addendum)
23 y.o. 6064w2d  G3P1011 comes in for elective induction.  Otherwise has good fetal movement and no bleeding.  Past Medical History  Diagnosis Date  . ADHD (attention deficit hyperactivity disorder)   . Panic attacks   . Depression   . BV (bacterial vaginosis)   . Anemia     Past Surgical History  Procedure Laterality Date  . Induced abortion      OB History  Gravida Para Term Preterm AB SAB TAB Ectopic Multiple Living  3 1 1  0 1 0 1 0 0 1    # Outcome Date GA Lbr Len/2nd Weight Sex Delivery Anes PTL Lv  3 CUR           2 TRM      SVD     1 TAB               History   Social History  . Marital Status: Single    Spouse Name: N/A    Number of Children: N/A  . Years of Education: N/A   Occupational History  . Not on file.   Social History Main Topics  . Smoking status: Former Smoker -- 1.00 packs/day    Types: Cigarettes    Quit date: 07/26/2013  . Smokeless tobacco: Not on file  . Alcohol Use: No  . Drug Use: No  . Sexual Activity: Not Currently   Other Topics Concern  . Not on file   Social History Narrative  . No narrative on file   Review of patient's allergies indicates no known allergies.    Prenatal Transfer Tool  Maternal Diabetes: No Genetic Screening: Normal Maternal Ultrasounds/Referrals: Normal Fetal Ultrasounds or other Referrals:  None Maternal Substance Abuse:  No Significant Maternal Medications:  None Significant Maternal Lab Results: None  Other PNC: uncomplicated.    Filed Vitals:   04/20/14 0736  BP: 106/66  Pulse: 100  Temp: 98.3 F (36.8 C)  Resp: 20     Lungs/Cor:  NAD Abdomen:  soft, gravid Ex:  no cords, erythema SVE:  2/50/-2, AROM clear FHTs:  120s, gSTV, NST R Toco:  q occ   A/P   Term for induction.  GBS neg.  Loney LaurenceMichelle A Charly Hunton

## 2014-04-20 NOTE — Anesthesia Preprocedure Evaluation (Signed)
Anesthesia Evaluation  Patient identified by MRN, date of birth, ID band Patient awake    Reviewed: Allergy & Precautions, H&P , Patient's Chart, lab work & pertinent test results  Airway Mallampati: III TM Distance: >3 FB Neck ROM: Full    Dental no notable dental hx. (+) Teeth Intact   Pulmonary former smoker,  breath sounds clear to auscultation  Pulmonary exam normal       Cardiovascular negative cardio ROS  Rhythm:Regular Rate:Normal     Neuro/Psych PSYCHIATRIC DISORDERS Anxiety Depression ADHD Panic attacksnegative neurological ROS  negative psych ROS   GI/Hepatic negative GI ROS, Neg liver ROS,   Endo/Other  Obesity  Renal/GU negative Renal ROS  negative genitourinary   Musculoskeletal negative musculoskeletal ROS (+)   Abdominal (+) + obese,   Peds  Hematology negative hematology ROS (+) anemia ,   Anesthesia Other Findings   Reproductive/Obstetrics (+) Pregnancy                           Anesthesia Physical Anesthesia Plan  ASA: II  Anesthesia Plan: Epidural   Post-op Pain Management:    Induction:   Airway Management Planned: Natural Airway  Additional Equipment:   Intra-op Plan:   Post-operative Plan:   Informed Consent: I have reviewed the patients History and Physical, chart, labs and discussed the procedure including the risks, benefits and alternatives for the proposed anesthesia with the patient or authorized representative who has indicated his/her understanding and acceptance.     Plan Discussed with: Anesthesiologist  Anesthesia Plan Comments:         Anesthesia Quick Evaluation

## 2014-04-20 NOTE — Anesthesia Procedure Notes (Signed)
Epidural Patient location during procedure: OB Start time: 04/20/2014 5:22 PM  Staffing Anesthesiologist: Dickey Caamano A. Performed by: anesthesiologist   Preanesthetic Checklist Completed: patient identified, site marked, surgical consent, pre-op evaluation, timeout performed, IV checked, risks and benefits discussed and monitors and equipment checked  Epidural Patient position: sitting Prep: site prepped and draped and DuraPrep Patient monitoring: continuous pulse ox and blood pressure Approach: midline Location: L3-L4 Injection technique: LOR air  Needle:  Needle type: Tuohy  Needle gauge: 17 G Needle length: 9 cm and 9 Needle insertion depth: 6 cm Catheter type: closed end flexible Catheter size: 19 Gauge Catheter at skin depth: 11 cm Test dose: negative and Other  Assessment Events: blood not aspirated, injection not painful, no injection resistance, negative IV test and no paresthesia  Additional Notes Patient identified. Risks and benefits discussed including failed block, incomplete  Pain control, post dural puncture headache, nerve damage, paralysis, blood pressure Changes, nausea, vomiting, reactions to medications-both toxic and allergic and post Partum back pain. All questions were answered. Patient expressed understanding and wished to proceed. Sterile technique was used throughout procedure. Epidural site was Dressed with sterile barrier dressing. No paresthesias, signs of intravascular injection Or signs of intrathecal spread were encountered.  Patient was more comfortable after the epidural was dosed. Please see RN's note for documentation of vital signs and FHR which are stable.

## 2014-04-21 LAB — CBC
HEMATOCRIT: 35.4 % — AB (ref 36.0–46.0)
Hemoglobin: 11.8 g/dL — ABNORMAL LOW (ref 12.0–15.0)
MCH: 30 pg (ref 26.0–34.0)
MCHC: 33.3 g/dL (ref 30.0–36.0)
MCV: 90.1 fL (ref 78.0–100.0)
Platelets: 174 10*3/uL (ref 150–400)
RBC: 3.93 MIL/uL (ref 3.87–5.11)
RDW: 15.6 % — ABNORMAL HIGH (ref 11.5–15.5)
WBC: 15.5 10*3/uL — AB (ref 4.0–10.5)

## 2014-04-21 NOTE — Progress Notes (Signed)
Patient is eating, ambulating, voiding.  Pain control is good.  Filed Vitals:   04/20/14 1916 04/20/14 2015 04/20/14 2130 04/21/14 0410  BP: 124/86 116/71 115/71 98/67  Pulse: 68 86 92 88  Temp:   97.9 F (36.6 C) 98 F (36.7 C)  TempSrc:   Oral Oral  Resp:  18 18 20   Height:      Weight:      SpO2:   97%     Fundus firm Perineum without swelling.  Lab Results  Component Value Date   WBC 15.5* 04/21/2014   HGB 11.8* 04/21/2014   HCT 35.4* 04/21/2014   MCV 90.1 04/21/2014   PLT 174 04/21/2014    --/--/A POS (05/08 0745)/RI  A/P Post partum day 1.  Routine care.  Expect d/c routine.    Loney LaurenceMichelle A Fred Hammes

## 2014-04-21 NOTE — Progress Notes (Deleted)
Clinical Social Work Department PSYCHOSOCIAL ASSESSMENT - MATERNAL/CHILD 04/21/2014  Patient:  Melissa Levine,Melissa Levine  Account Number:  [id number]  Admit Date:  04/19/2014  Childs Name:   Ny'Ayla Michelle Melissa Levine    Clinical Social Worker:  Keyston Ardolino, LCSW   Date/Time:  04/21/2014 12:30 PM  Date Referred:  04/21/2014   Referral source  Central Nursery     Referred reason  Substance Abuse   Other referral source:    I:  FAMILY / HOME ENVIRONMENT Child's legal guardian:  PARENT  Guardian - Name Guardian - Age Guardian - Address  Melissa Levine,Melissa Levine [address]   [place], [place] [address]  Melissa Levine, Melissa Levine 21 same as above   Other household support members/support persons Other support:    II  PSYCHOSOCIAL DATA Information Source:    Financial and Community Resources Employment:   Father works full time and mother works parttime.   Financial resources:  Medicaid If Medicaid - County:   Other  WIC  Food Stamps   School / Grade:   Maternity Care Coordinator / Child Services Coordination / Early Interventions:  Cultural issues impacting care:    III  STRENGTHS Strengths  Home prepared for Child (including basic supplies)  Supportive family/friends  Adequate Resources   Strength comment:    IV  RISK FACTORS AND CURRENT PROBLEMS Current Problem:     Risk Factor & Current Problem Patient Issue Family Issue Risk Factor / Current Problem Comment  Substance Abuse Y N Mother has hx of marijuana use   N N     V  SOCIAL WORK ASSESSMENT Acknowledged Social Work consult to assess mother's history of marijuana.  Mother was receptive to social work intervention.  She is a single parent with no other dependents.  She and FOB cohabitate.  Both she and FOB are employed.  Mother reports hx of recreational use of marijuana.  She denies any other illicit drug use. Discussed possible negative consequences of continue use of marijuana.  She was receptive to the information.  She denies the  need for any treatment.      Mother informed of the hospitals drug screen policy.  UDS on newborn was negative.  Mother denies any hx of mental illness. Informed her of CSW availability.      VI SOCIAL WORK PLAN Social Work Plan  Barriers to Discharge   Type of pt/family education:   If child protective services report - county:   If child protective services report - date:   Information/referral to community resources comment:   Pediatrician:  [facility]   Other social work plan:   Will continue to monitor drug screen     

## 2014-04-21 NOTE — Progress Notes (Signed)

## 2014-04-21 NOTE — Discharge Summary (Signed)
Obstetric Discharge Summary Reason for Admission: induction of labor Prenatal Procedures: none Intrapartum Procedures: spontaneous vaginal delivery Postpartum Procedures: none Complications-Operative and Postpartum: 1st degree perineal laceration Hemoglobin  Date Value Ref Range Status  04/21/2014 11.8* 12.0 - 15.0 g/dL Final     HCT  Date Value Ref Range Status  04/21/2014 35.4* 36.0 - 46.0 % Final    Discharge Diagnoses: Term Pregnancy-delivered  Discharge Information: Date: 04/21/2014 Activity: pelvic rest Diet: routine Medications: Ibuprofen Condition: stable Instructions: refer to practice specific booklet Discharge to: home   Newborn Data: Live born female  Birth Weight: 8 lb 2.7 oz (3705 g) APGAR: 8, 9  Home with mother.  Melissa Levine 04/21/2014, 10:05 AM

## 2014-04-21 NOTE — Anesthesia Postprocedure Evaluation (Signed)
  Anesthesia Post-op Note  Patient: Melissa HanlonCourtney N Caudillo  Procedure(s) Performed: * No procedures listed *  Patient Location: Mother/Baby  Anesthesia Type:Epidural  Level of Consciousness: awake and alert   Airway and Oxygen Therapy: Patient Spontanous Breathing  Post-op Pain: mild  Post-op Assessment: Post-op Vital signs reviewed, Patent Airway, Pain level controlled, No headache, No residual numbness and No residual motor weakness  Post-op Vital Signs: Reviewed and stable  Last Vitals:  Filed Vitals:   04/21/14 0410  BP: 98/67  Pulse: 88  Temp: 36.7 C  Resp: 20    Complications: No apparent anesthesia complications

## 2014-04-23 NOTE — Progress Notes (Signed)
Post discharge ur review completed. 

## 2014-05-08 ENCOUNTER — Inpatient Hospital Stay (HOSPITAL_COMMUNITY)
Admission: AD | Admit: 2014-05-08 | Discharge: 2014-05-08 | Disposition: A | Discharge status: Home / Self Care | Payer: Medicaid Other | Source: Ambulatory Visit | Attending: Obstetrics and Gynecology | Admitting: Obstetrics and Gynecology

## 2014-05-08 ENCOUNTER — Encounter (HOSPITAL_COMMUNITY): Payer: Self-pay | Admitting: *Deleted

## 2014-05-08 DIAGNOSIS — O9989 Other specified diseases and conditions complicating pregnancy, childbirth and the puerperium: Principal | ICD-10-CM

## 2014-05-08 DIAGNOSIS — O99893 Other specified diseases and conditions complicating puerperium: Secondary | ICD-10-CM | POA: Insufficient documentation

## 2014-05-08 DIAGNOSIS — R209 Unspecified disturbances of skin sensation: Secondary | ICD-10-CM | POA: Insufficient documentation

## 2014-05-08 DIAGNOSIS — R42 Dizziness and giddiness: Secondary | ICD-10-CM | POA: Insufficient documentation

## 2014-05-08 DIAGNOSIS — R1011 Right upper quadrant pain: Secondary | ICD-10-CM | POA: Insufficient documentation

## 2014-05-08 DIAGNOSIS — Z87891 Personal history of nicotine dependence: Secondary | ICD-10-CM | POA: Insufficient documentation

## 2014-05-08 LAB — URINE MICROSCOPIC-ADD ON

## 2014-05-08 LAB — URINALYSIS, ROUTINE W REFLEX MICROSCOPIC
Bilirubin Urine: NEGATIVE
GLUCOSE, UA: NEGATIVE mg/dL
KETONES UR: NEGATIVE mg/dL
Nitrite: NEGATIVE
Protein, ur: NEGATIVE mg/dL
SPECIFIC GRAVITY, URINE: 1.025 (ref 1.005–1.030)
Urobilinogen, UA: 0.2 mg/dL (ref 0.0–1.0)
pH: 6 (ref 5.0–8.0)

## 2014-05-08 NOTE — MAU Provider Note (Signed)
History     CSN: [id number]  Arrival date and time: 05/08/14 1603   First Provider Initiated Contact with Patient 05/08/14 1822      Chief Complaint  Patient presents with  . Dizziness  . Numbness   HPI  Melissa Levine is a 23 y.o. female [id number] status post vaginal delivery 2 weeks ago. The patient woke up with nausea and vomiting; she vomited 1 times. She attempted to eat pizza for lunch and shortly after she felt very sick. The patient has had diarrhea several times this morning. The patient presents following an episode of right upper quadrant abdominal pain, dizziness and dumbness in bilateral hands that subsided quickly. On her way to the hospital she describes the pain in her RUQ as severe; lasting just a few seconds.  The patient called the office this afternoon with complaints and received a phone call back from Dr. Henderson Cloud while waiting in MAU; orders were called into the pharmacy.   RN note: Woke up at 0600 to breastfeed NB and felt nauseous. Have had diarrhea all morning. Vomited x 1 earlier today. Having cramping in upper abdomen. Ate pizza at 0100 to take iron and pain in upper abd started afterward. On way to hosp had severe pain upper abd and L hand went numb and then R. May have been breathing fast due to pain and was trying to slow down breathing         OB History   Grav Para Term Preterm Abortions TAB SAB Ect Mult Living   3 2 2  0 1 1 0 0 0 2      Past Medical History  Diagnosis Date  . ADHD (attention deficit hyperactivity disorder)   . Panic attacks   . Depression   . BV (bacterial vaginosis)   . Anemia     Past Surgical History  Procedure Laterality Date  . Induced abortion      Family History  Problem Relation Age of Onset  . Diabetes Brother   . Gallstones Mother     History  Substance Use Topics  . Smoking status: Former Smoker -- 1.00 packs/day    Types: Cigarettes    Quit date: 07/26/2013  . Smokeless tobacco: Not on file   . Alcohol Use: No    Allergies: No Known Allergies  Prescriptions prior to admission  Medication Sig Dispense Refill  . citalopram (CELEXA) 40 MG tablet Take 40 mg by mouth daily.      Marland Kitchen docusate sodium (COLACE) 100 MG capsule Take 100 mg by mouth 2 (two) times daily.      Marland Kitchen ibuprofen (ADVIL,[name] Take 400 mg by mouth every 6 (six) hours as needed for headache.      . IRON PO Take 1 tablet by mouth 2 (two) times daily.      . ondansetron (ZOFRAN) 8 MG tablet Take 8 mg by mouth every 8 (eight) hours as needed for nausea or vomiting.       Results for orders placed during the hospital encounter of 05/08/14 (from the past 72 hour(s))  URINALYSIS, ROUTINE W REFLEX MICROSCOPIC     Status: Abnormal   Collection Time    05/08/14  4:20 PM      Result Value Ref Range   Color, Urine YELLOW  YELLOW   APPearance CLEAR  CLEAR   Specific Gravity, Urine 1.025  1.005 - 1.030   pH 6.0  5.0 - 8.0   Glucose, UA NEGATIVE  NEGATIVE mg/dL   Hgb urine dipstick LARGE (*) NEGATIVE   Bilirubin Urine NEGATIVE  NEGATIVE   Ketones, ur NEGATIVE  NEGATIVE mg/dL   Protein, ur NEGATIVE  NEGATIVE mg/dL   Urobilinogen, UA 0.2  0.0 - 1.0 mg/dL   Nitrite NEGATIVE  NEGATIVE   Leukocytes, UA TRACE (*) NEGATIVE  URINE MICROSCOPIC-ADD ON     Status: Abnormal   Collection Time    05/08/14  4:20 PM      Result Value Ref Range   Squamous Epithelial / LPF FEW (*) RARE   WBC, UA 0-3  <3 WBC/hpf   Comment: CORRECTED RESULTS     NOTIFIED RASH,J AT 1820     NOTIFIED RASH,J AT 1820 ON 05/08/14 BY HAGGINSC     CORRECTED ON 05/26 AT 1827: PREVIOUSLY REPORTED AS 7 10   RBC / HPF 3-6  <3 RBC/hpf   Comment: CORRECTED RESULTS     NOTIFIED RASH,J AT 1820 ON 05/08/14 BY HAGGINSC     CORRECTED ON 05/26 AT 1827: PREVIOUSLY REPORTED AS 0 2   Urine-Other MICROSCOPIC EXAM PERFORMED ON UNCONCENTRATED URINE     Comment: LESS THAN 2ML COLLECTED     CORRECTED RESULTS     NOTIFIED RASH,J AT 1820 ON 05/08/14 BY HAGGINSC     Review of Systems  Constitutional: Negative for fever and chills.  Gastrointestinal: Negative for nausea, vomiting, abdominal pain (Non currently), diarrhea and constipation.   Physical Exam   Blood pressure 112/68, pulse 73, temperature 98.3 F (36.8 C), resp. rate 18, SpO2 98.00%, unknown if currently breastfeeding.  Physical Exam  Constitutional: She is oriented to person, place, and time. She appears well-developed and well-nourished. No distress.  HENT:  Head: Normocephalic.  Eyes: Pupils are equal, round, and reactive to light.  Neck: Neck supple.  GI: There is tenderness (RUQ tenderness on palpation ).  Neurological: She is alert and oriented to person, place, and time.  Skin: She is not diaphoretic.  Psychiatric: Her behavior is normal.    MAU Course  Procedures None  MDM Pt is asking to leave MAU without any further work up. Pt is not experiencing any of the symptoms she was having earlier today and wants to follow up with her PCP.  Discussed my concerns with the patient about her leaving MAU without any further workup. I informed her that I could not fully rule out cholecystitis  vs viral gastroenteritis vs other etiology's.  I discussed my concerns with Dr. Tenny Crawoss who agrees that if the patient leaves MAU she will need to sign out AMA. The patient agrees to return to MAU if symptoms return. She also plans to call the office to schedule a follow up appt.   Assessment and Plan   A:  RUQ abdominal pain  P:  Pt to leave AMA   Melissa HansenJennifer Irene Rasch, NP 05/10/2014, 9:42 AM

## 2014-05-08 NOTE — MAU Note (Signed)
Pt is post partum 2 weeks. Stared having abd cramping and diarrhea this morning. Hands are feeling numb and feeling weak.

## 2014-05-08 NOTE — MAU Note (Signed)
Woke up at 0600 to breastfeed NB and felt nauseous. Have had diarrhea all  morning. Vomited x 1 earlier today. Having cramping in upper abdomen. Ate pizza at 0100 to take iron and pain in upper abd started afterward. On way to hosp had severe pain upper abd and L hand went numb and then R. May have been breathing fast due to pain and was trying to slow down breathing

## 2014-09-09 IMAGING — US US TRANSVAGINAL NON-OB
1 series · 14 of 22 positions shown · non-contrast
Comparison: US OB on 09/28/2012

CLINICAL DATA: Heavy vaginal bleeding, history of abortion on
10/21/2012

TRANSVAGINAL ULTRASOUND OF PELVIS
TECHNIQUE: Transvaginal ultrasound examination of the pelvis was
performed including evaluation of the uterus, ovaries, adnexal
regions, and pelvic cul-de-sac.

[Series 1: us transvaginal non-ob · 14 of 22 slices shown]
[im 1/22]
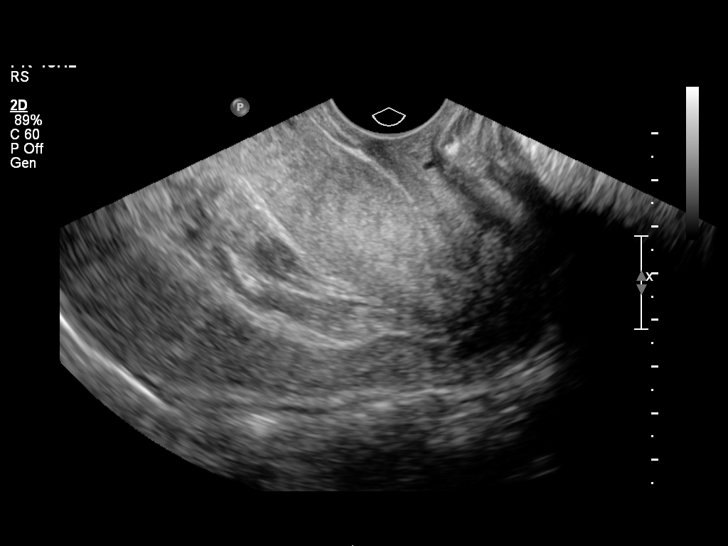
[im 3/22]
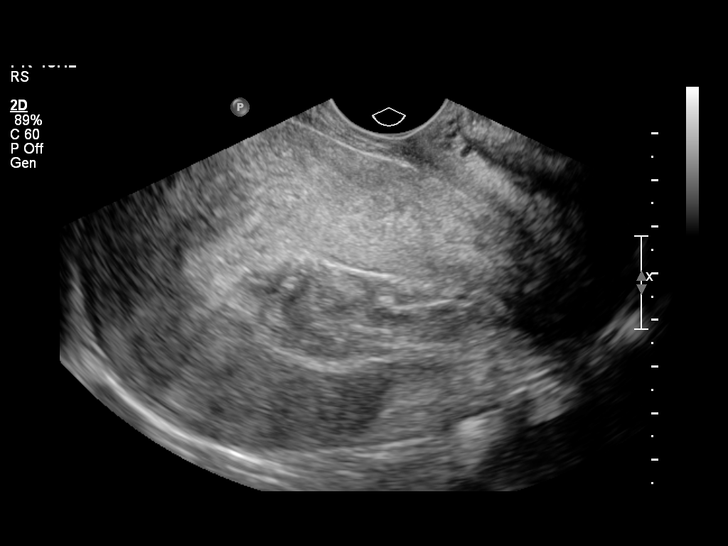
[im 4/22]
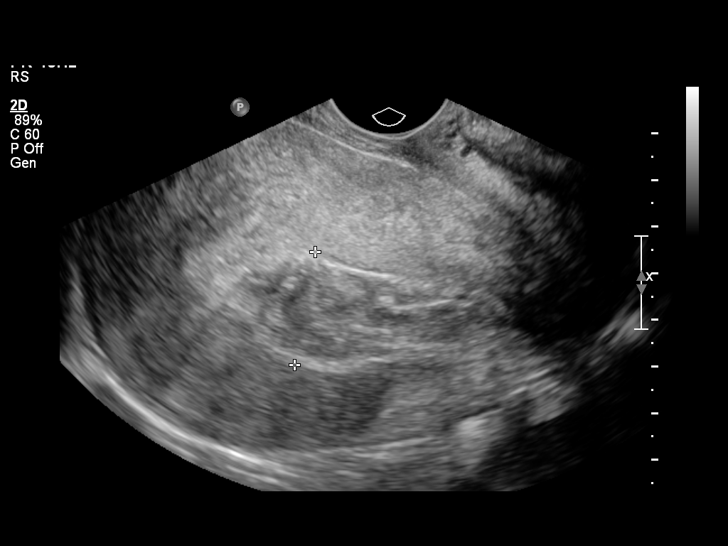
[im 6/22]
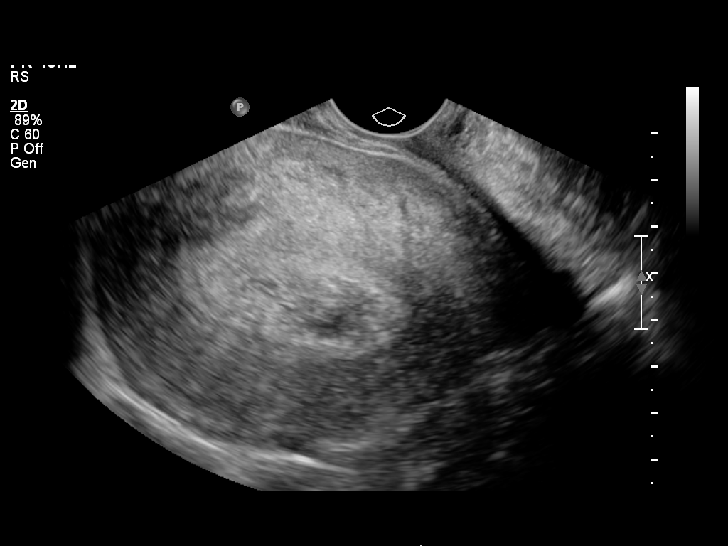
[im 8/22]
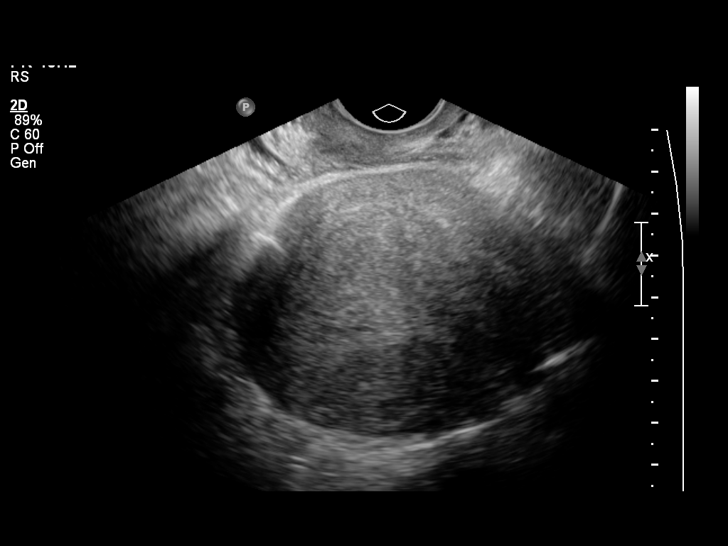
[im 9/22]
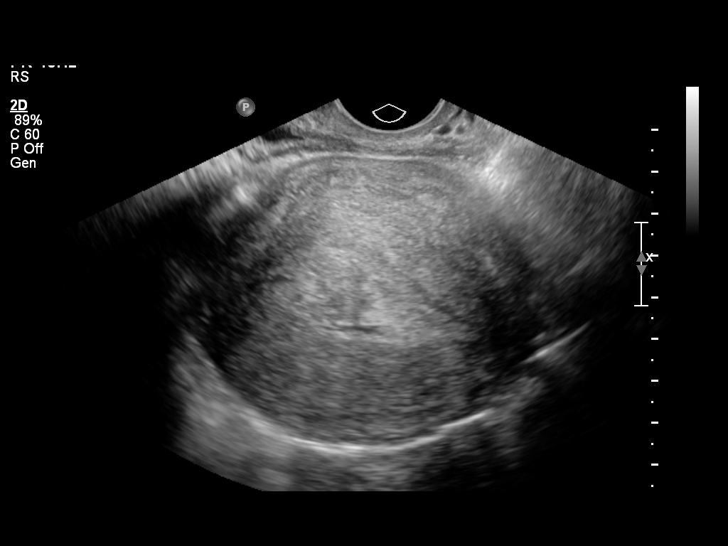
[im 11/22]
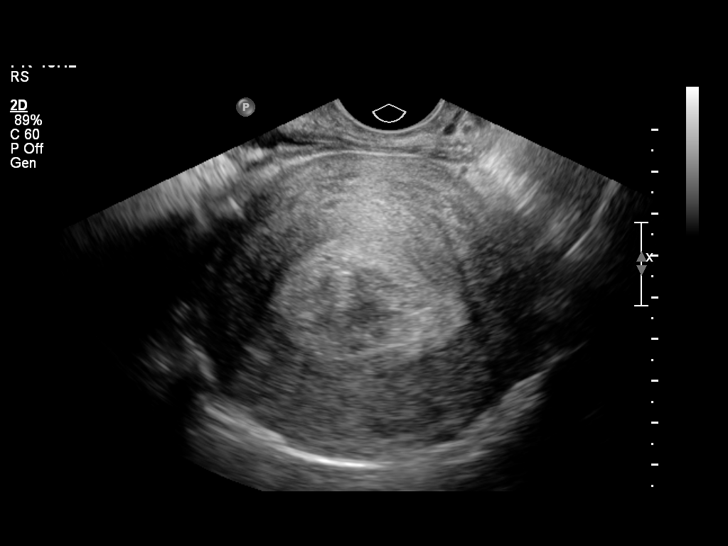
[im 12/22]
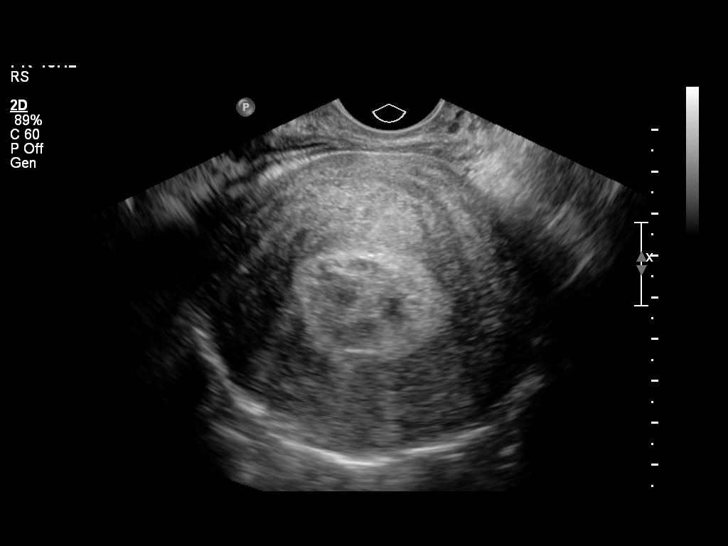
[im 14/22]
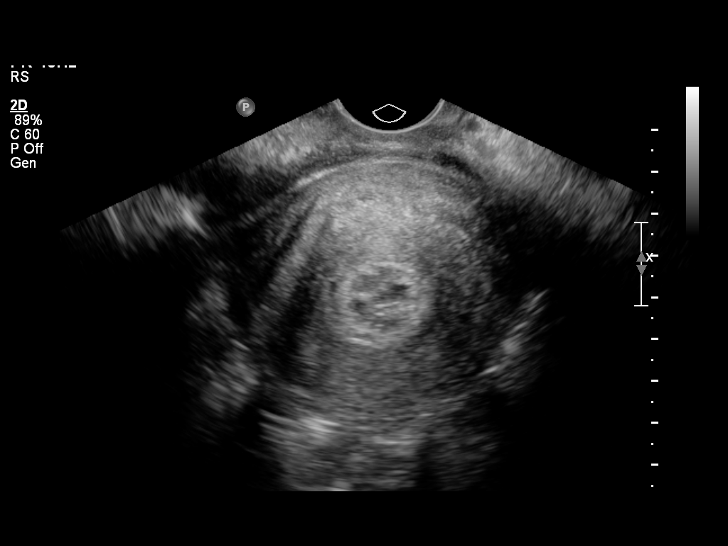
[im 15/22]
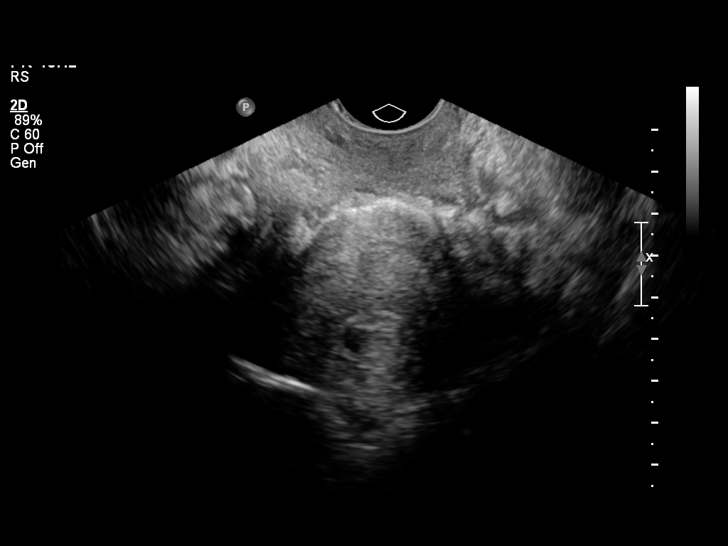
[im 17/22]
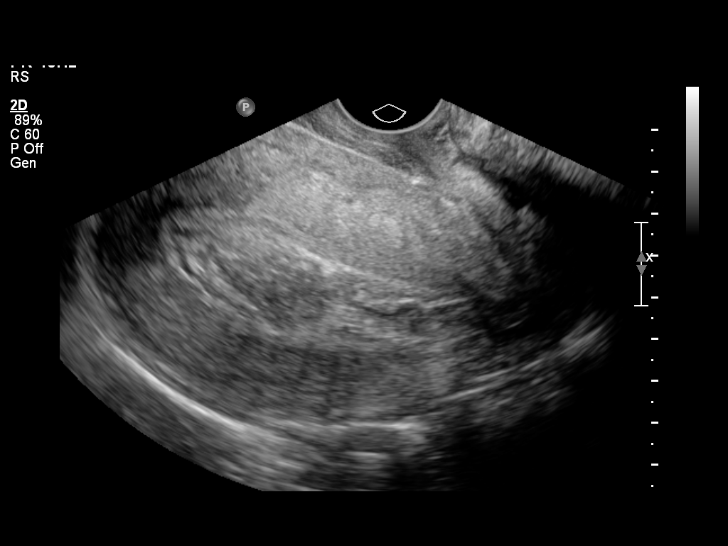
[im 19/22]
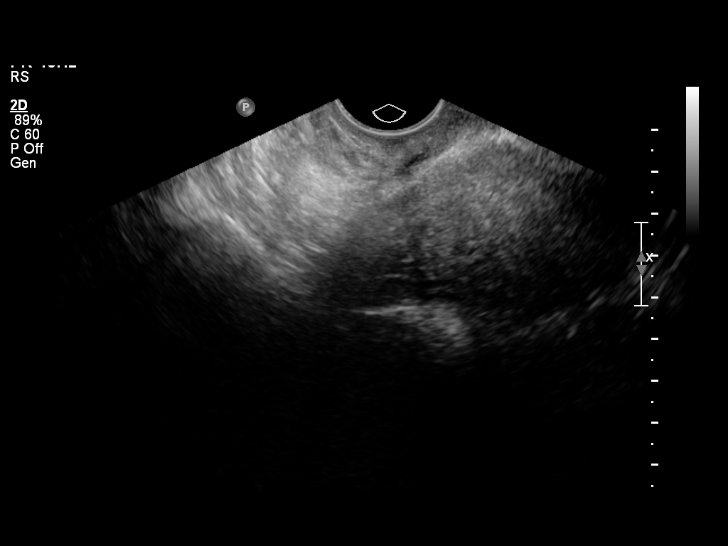
[im 20/22]
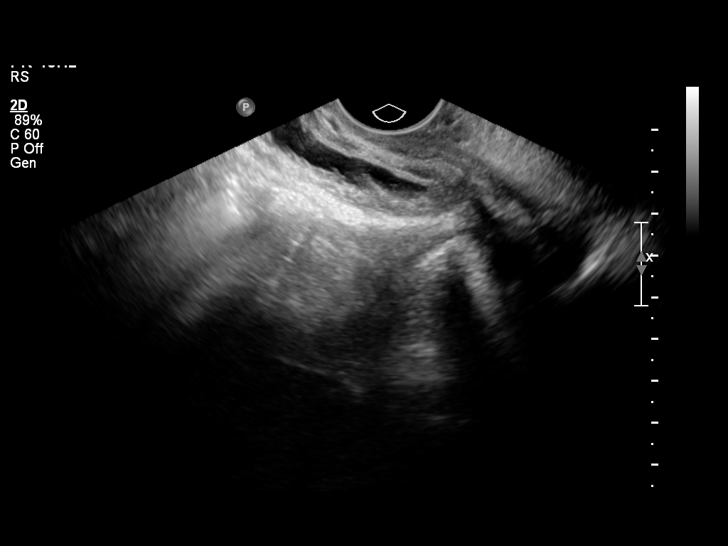
[im 22/22]
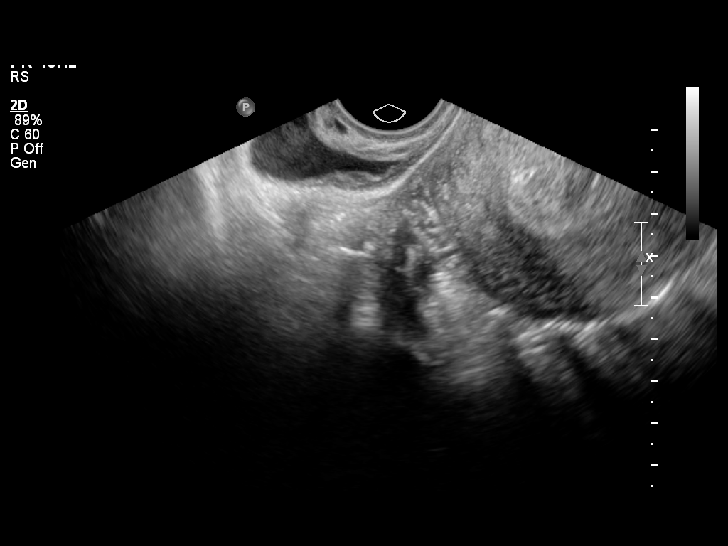

[14 of 22 positions shown; findings below may reference images not displayed]

FINDINGS: Uterus:  The uterus is enlarged secondary to recent pregnancy, and
was not measured.

Endometrium: The endometrium is thickened and inhomogeneous
measuring up to 24.6 mm.  This inhomogeneous material most like
represents blood clot, but retained products would be difficult to
exclude.

Right ovary: Not visualized.

Left ovary: Not visualized.

Other Findings:  No free fluid is seen.
IMPRESSION: 1.  Inhomogeneous material within the endometrium most likely
represents blood clot.  Retained products of conception would be
difficult to exclude.
2.  Neither ovary is visualized.

## 2014-10-15 ENCOUNTER — Encounter (HOSPITAL_COMMUNITY): Payer: Self-pay | Admitting: *Deleted

## 2014-10-25 IMAGING — US US PELVIS COMPLETE
1 series · 14 of 25 positions shown · non-contrast
Comparison: 10/25/2012

CLINICAL DATA: Abdominal pain.  Possible retained products of
conception.  Emesis. Spotting/bleeding.  Abortion more than 2
months ago.  LMP 12/02/2012.



[Series 1: us pelvis complete · 0.24mm/px · 259 acquisitions, 14 frames shown]
[im 1/259]
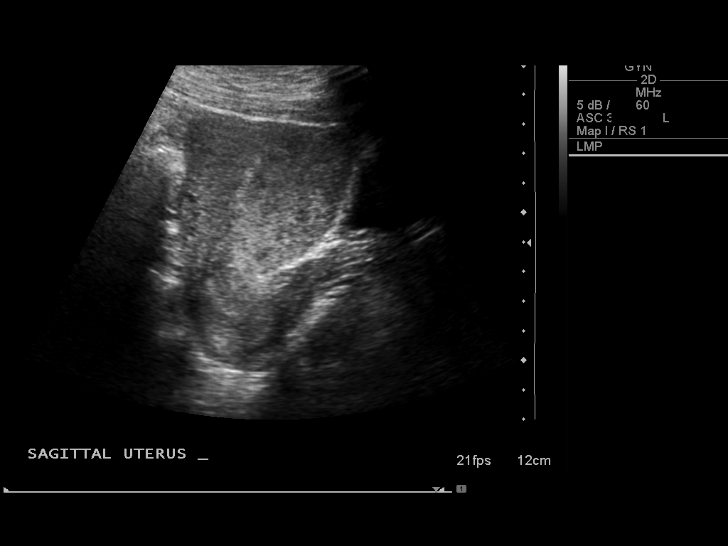
[im 22/259]
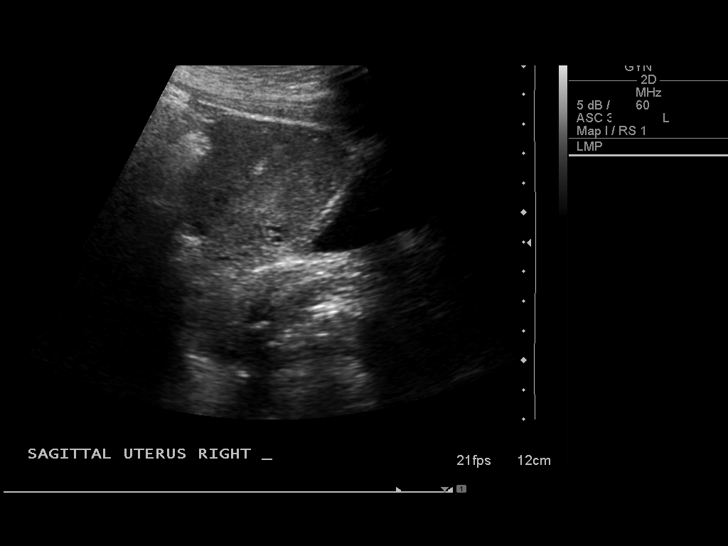
[im 44/259]
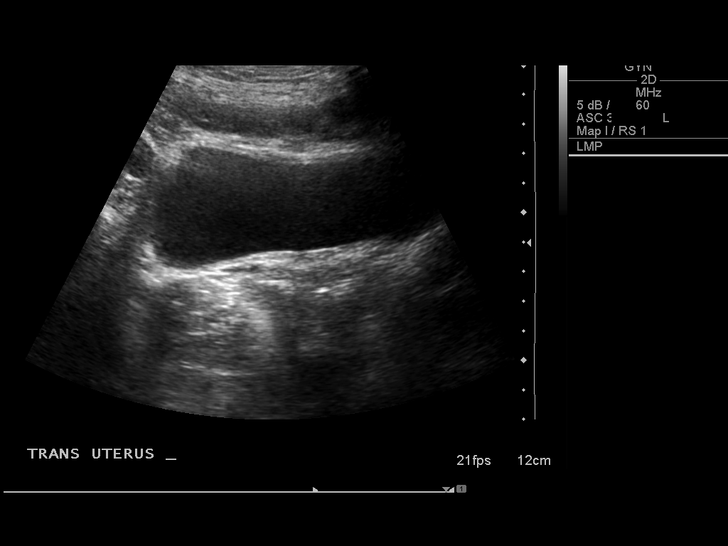
[im 65/259]
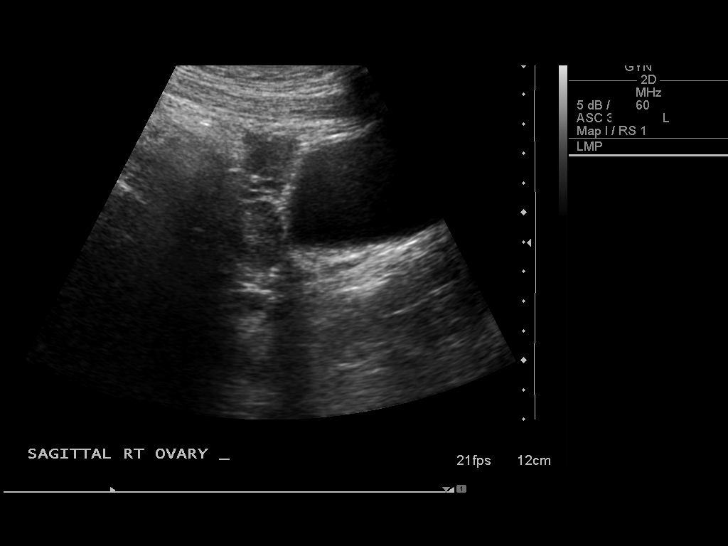
[im 87/259]
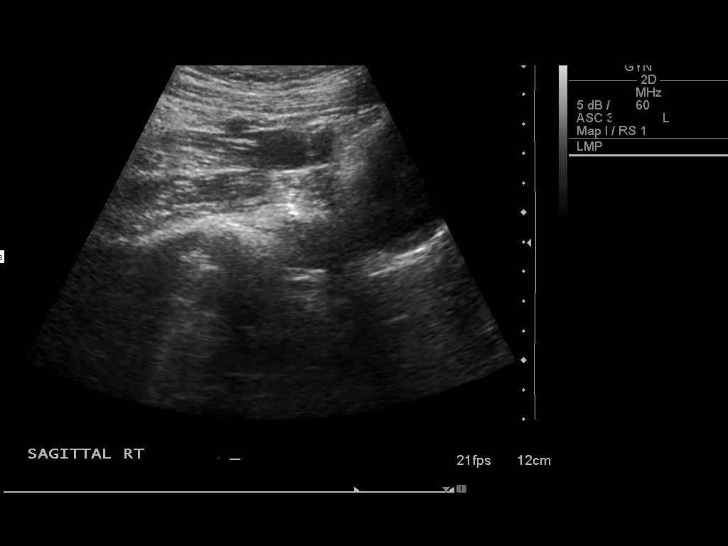
[im 97/259]
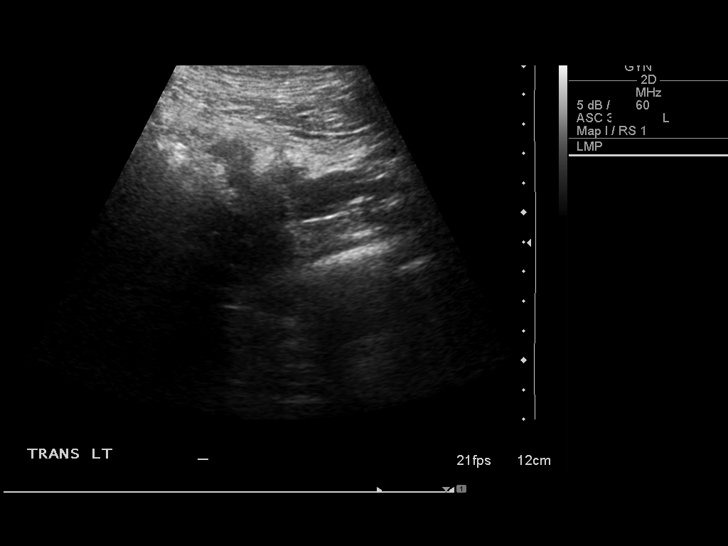
[im 119/259]
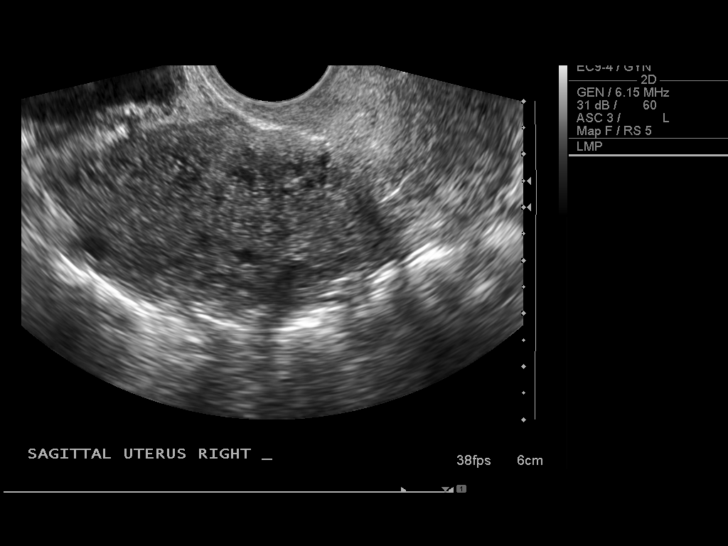
[im 140/259]
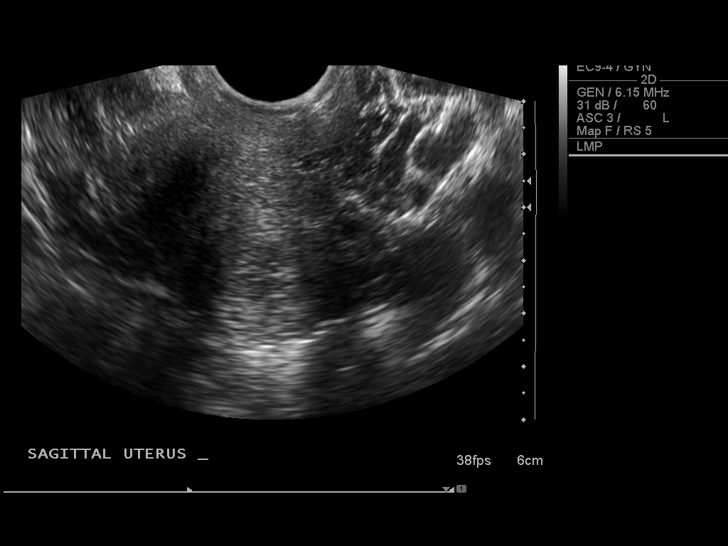
[im 162/259]
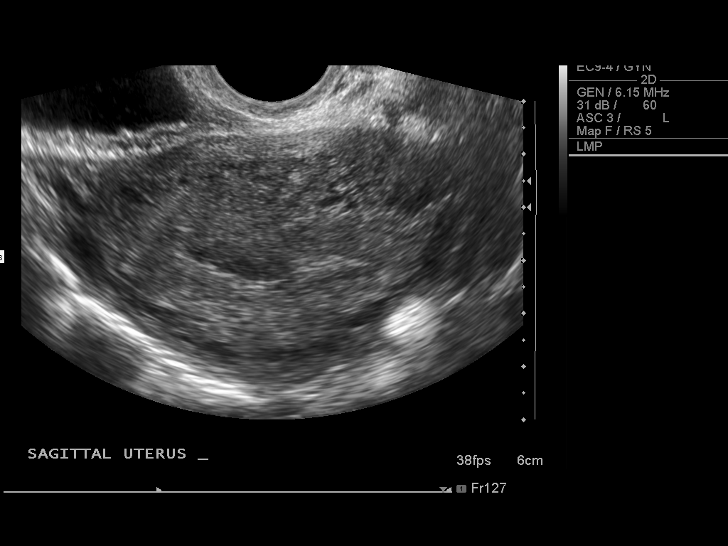
[im 173/259]
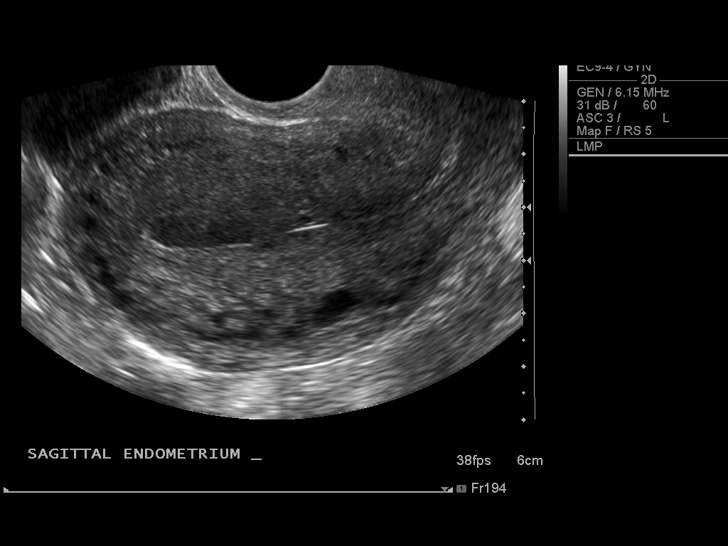
[im 194/259]
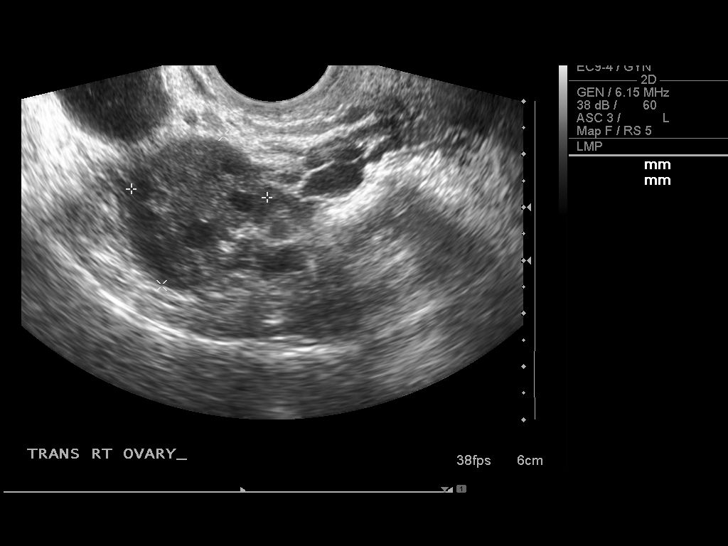
[im 216/259]
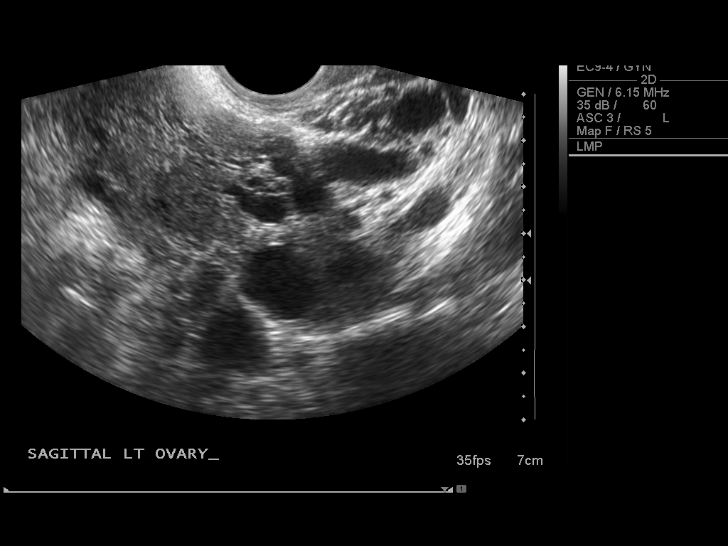
[im 237/259]
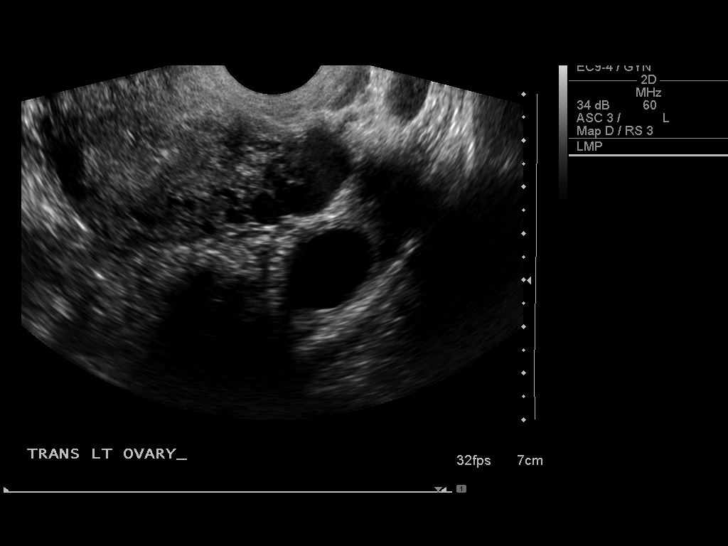
[im 259/259]
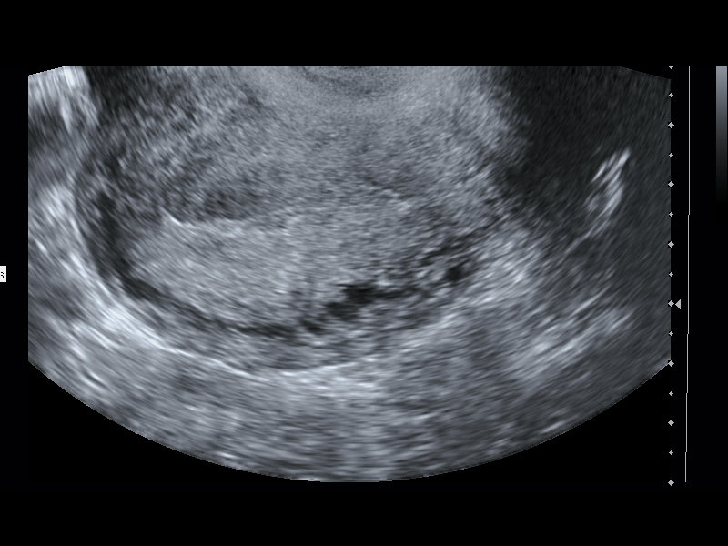

[14 of 25 positions shown; findings below may reference images not displayed]

FINDINGS: Uterus: 9.2 x 4.6 x 6.3 cm.  No focal mass identified.

Endometrium: 7.1 mm, homogeneous.

Right ovary:  3.1 x 3.1 x 2.6 cm.  Normal in appearance.

Left ovary: 3.6 x 2.0 x 3.0 cm.  Small follicle is 2.0 cm.

Other findings: No free fluid
IMPRESSION: 1.  Normal appearance of the uterus and ovaries.
2.  No evidence for retained products of conception.
3.  No free pelvic fluid..

## 2015-01-08 ENCOUNTER — Encounter: Payer: Self-pay | Admitting: Infectious Diseases

## 2015-01-08 ENCOUNTER — Ambulatory Visit (INDEPENDENT_AMBULATORY_CARE_PROVIDER_SITE_OTHER): Payer: Medicaid Other | Admitting: Infectious Diseases

## 2015-01-08 VITALS — BP 129/86 | HR 112 | Temp 97.7°F | Wt 239.0 lb

## 2015-01-08 DIAGNOSIS — B009 Herpesviral infection, unspecified: Secondary | ICD-10-CM

## 2015-01-08 DIAGNOSIS — L0201 Cutaneous abscess of face: Secondary | ICD-10-CM | POA: Diagnosis not present

## 2015-01-08 DIAGNOSIS — L03211 Cellulitis of face: Secondary | ICD-10-CM

## 2015-01-08 MED ORDER — SULFAMETHOXAZOLE-TRIMETHOPRIM 800-160 MG PO TABS
1.0000 | ORAL_TABLET | Freq: Two times a day (BID) | ORAL | Status: DC
Start: 2015-01-08 — End: 2015-05-29

## 2015-01-08 MED ORDER — VALACYCLOVIR HCL 1 G PO TABS: 1000.0000 mg | ORAL_TABLET | Freq: Two times a day (BID) | ORAL | Status: DC

## 2015-01-08 MED ORDER — CHLORHEXIDINE GLUCONATE 2 % EX LIQD: 1.0000 | CUTANEOUS | Status: AC

## 2015-01-08 NOTE — Progress Notes (Signed)
   Subjective:    Patient ID: Melissa Levine, female    DOB: 11/12/1991, 24 y.o.   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  She was started on bactrim 11-27-14 after she developed a boil on the L side of her neck. Cx at that time grew MSSA, MRSA (S- doxy, vanco, rif, bactrim, linezolid, clinda). Currently off anbx for 3 weeks. Current facial lesions have been present for 3-4 days. Painful, burning, hyperesthetic.   Had previously been treated with doxy for R thigh staph infection.  She has previously been treated with bactroban, been instructed on how to take "bleach baths". Instructed to use antibacterial soap.  Has been off mupirocin for 1.5 weeks. Still using antibacterial soap (dial). Still washing clothes in hot water.   There was some concern that she may be "picking" at the lesions from her ADD.   She has 2 areas on her chin which she feels are new.  No f/c.   Father has had staph infections as well. Per pt, had to have screws put in his back due to infection there.   appetite variable. Teeth came in normally as a child. No problems with yeast infections.  Review of Systems  Constitutional: Negative for fever, chills and unexpected weight change.  Gastrointestinal: Negative for diarrhea and constipation.  Genitourinary: Negative for difficulty urinating and menstrual problem.  Skin: Positive for rash and wound.  Psychiatric/Behavioral: The patient is nervous/anxious.        Objective:   Physical Exam  Constitutional: She appears well-developed and well-nourished.  HENT:  Mouth/Throat: No oropharyngeal exudate.  Eyes: EOM are normal. Pupils are equal, round, and reactive to light.  Neck: Neck supple.  Cardiovascular: Normal rate, regular rhythm and normal heart sounds.   Pulmonary/Chest: Effort normal and breath sounds normal.  Abdominal: Soft. Bowel sounds are normal. She exhibits no  distension. There is no tenderness.  Lymphadenopathy:    She has no cervical adenopathy.  Skin:             Assessment & Plan:

## 2015-01-08 NOTE — Assessment & Plan Note (Signed)
Her current facial lesions, and their description, raise question of HSV. Will give her 1 week of valtrex.

## 2015-01-08 NOTE — Assessment & Plan Note (Addendum)
She does not appear to have active lesions.  Will recommend- 1 week of bactrim Restart mupirocin weekly Continue washing clothes in hot water.  CHG baths weekly Is she conttinues to have lesions, will consider checking her immune system- repeat HIV (states she had done when pregnant), CD4, Ab levels, neutrophil burst assay.  rtc in 1 month, sooner if acute lesions

## 2015-02-06 ENCOUNTER — Ambulatory Visit: Payer: Medicaid Other | Admitting: Infectious Diseases

## 2015-02-25 ENCOUNTER — Telehealth: Payer: Self-pay | Admitting: *Deleted

## 2015-02-25 NOTE — Telephone Encounter (Signed)
Patient called and advised the biols she was seen about have returned and are worse. She would like to see the doctor asap as she has a 542 month old and 26 year old she does not want to get infected. She is very concerned and upset. Gave her our first available appt 02/27/15 with Dr Daiva EvesVan Dam.

## 2015-02-27 ENCOUNTER — Ambulatory Visit: Payer: Medicaid Other | Admitting: Infectious Disease

## 2015-03-27 ENCOUNTER — Ambulatory Visit: Payer: Medicaid Other | Admitting: Infectious Diseases

## 2015-03-27 ENCOUNTER — Telehealth: Payer: Self-pay | Admitting: *Deleted

## 2015-03-27 NOTE — Telephone Encounter (Signed)
[facility].  RN left message with this information. Andree CossHowell, Marlaya Turck M, RN

## 2015-05-29 ENCOUNTER — Ambulatory Visit (INDEPENDENT_AMBULATORY_CARE_PROVIDER_SITE_OTHER): Payer: Medicaid Other | Admitting: Infectious Diseases

## 2015-05-29 ENCOUNTER — Other Ambulatory Visit: Payer: Self-pay | Admitting: Infectious Diseases

## 2015-05-29 ENCOUNTER — Encounter: Payer: Self-pay | Admitting: Infectious Diseases

## 2015-05-29 DIAGNOSIS — L03211 Cellulitis of face: Secondary | ICD-10-CM | POA: Diagnosis not present

## 2015-05-29 DIAGNOSIS — L0201 Cutaneous abscess of face: Secondary | ICD-10-CM | POA: Diagnosis not present

## 2015-05-29 MED ORDER — ORITAVANCIN DIPHOSPHATE 400 MG IV SOLR: 1200.0000 mg | Freq: Once | INTRAVENOUS | Status: AC

## 2015-05-29 NOTE — Progress Notes (Signed)
   Subjective:    Patient ID: Melissa Levine, female    DOB: 11/03/1991, 24 y.o.   MRN: [id number]  HPI 24 yo F with hx of ADD, anxiety, depression, and MRSA infection on her face/thighs/shoulders since August.  She was started on bactrim 11-27-14 after she developed a boil on the L side of her neck. Cx at that time grew MSSA, MRSA (S- doxy, vanco, rif, bactrim, linezolid, clinda). Had previously been treated with doxy for R thigh staph infection.  She has previously been treated with bactroban, been instructed on how to take "bleach baths". Instructed to use antibacterial soap.   There has been some concern that she may be "picking" at the lesions from her ADD.  Back today with continued skin lesions.  Has questions about whether or not she should be on IV anbx.  States that whenever she has any trauma she gets pustulent lesion.  Has been off anbx since January. Has used bactroban intermittently.  Review of Systems  Constitutional: Negative for fever and chills.       Objective:   Physical Exam  Constitutional: She appears well-developed and well-nourished.  HENT:  Mouth/Throat: No oropharyngeal exudate.  Eyes: EOM are normal. Pupils are equal, round, and reactive to light.  Neck: Neck supple.  Cardiovascular: Normal rate, regular rhythm and normal heart sounds.   Pulmonary/Chest: Effort normal and breath sounds normal.  Abdominal: Soft. Bowel sounds are normal. There is no tenderness.  Lymphadenopathy:    She has no cervical adenopathy.       Assessment & Plan:

## 2015-05-29 NOTE — Assessment & Plan Note (Signed)
Will check labs on her for immune status Will give her one time dose of oritavancin Encouraged her to: Use bactroban bid for next 5 days Use bactroban for first 5 days of the month for the next 6 months Have a derm eval Wash clothes in hot water Stop using make up on her face.  rtc 2-3 months

## 2015-05-30 ENCOUNTER — Inpatient Hospital Stay (HOSPITAL_COMMUNITY): Admission: RE | Admit: 2015-05-30 | Payer: Medicaid Other | Source: Ambulatory Visit

## 2015-05-30 LAB — IGG, IGA, IGM
IGM, SERUM: 92 mg/dL (ref 52–322)
IgA: 224 mg/dL (ref 69–380)
IgG (Immunoglobin G), Serum: 934 mg/dL (ref 690–1700)

## 2015-05-30 LAB — HIV ANTIBODY (ROUTINE TESTING W REFLEX): HIV 1&2 Ab, 4th Generation: NONREACTIVE

## 2015-05-31 ENCOUNTER — Ambulatory Visit (HOSPITAL_COMMUNITY)
Admission: RE | Admit: 2015-05-31 | Discharge: 2015-05-31 | Disposition: A | Discharge status: Home / Self Care | Payer: Medicaid Other | Source: Ambulatory Visit | Attending: Infectious Diseases | Admitting: Infectious Diseases

## 2015-05-31 ENCOUNTER — Telehealth: Payer: Self-pay | Admitting: *Deleted

## 2015-05-31 DIAGNOSIS — L0201 Cutaneous abscess of face: Secondary | ICD-10-CM | POA: Diagnosis not present

## 2015-05-31 DIAGNOSIS — L03211 Cellulitis of face: Secondary | ICD-10-CM | POA: Diagnosis present

## 2015-05-31 LAB — COMPLEMENT, TOTAL

## 2015-05-31 LAB — T-HELPER CELL (CD4) - (RCID CLINIC ONLY)
CD4 % Helper T Cell: 38 % (ref 33–55)
CD4 T Cell Abs: 1110 /uL (ref 400–2700)

## 2015-05-31 MED ORDER — ORITAVANCIN DIPHOSPHATE 400 MG IV SOLR
1200.0000 mg | Freq: Once | INTRAVENOUS | Status: AC
  Administered 2015-05-31: 1200 mg via INTRAVENOUS
  Filled 2015-05-31: qty 120

## 2015-05-31 NOTE — Progress Notes (Signed)
Patient ID: Melissa Levine, female   DOB: 1991/08/12, 24 y.o.   MRN: [id number] Pt of Dr.Hatcher arrived to [facility] for a treatment. IV access was established and medication infused for over an hour when pt reported a child care issue that would force her to leave without finishing her treatment. Dr.Hatcher was notified. Treatment was interrupted. IV infusion stopped and IV access discontinued. Pt was notified that she only received one hour of infusion instead of scheduled three hours, pt verbalized understanding. Tolerated procedure well, no complaints during infusion. Discharged to home.  [facility]

## 2015-05-31 NOTE — Progress Notes (Signed)
Patient presented to [facility] for infusion of Oritavancin.  Patient's mother unable to continue watching children.  Patient unable to complete infusion at this time.  She recvd approximately 1 hour of the antibiotic.  Dr. Ninetta Lights notified and is aware.

## 2015-05-31 NOTE — Telephone Encounter (Signed)
Unable to refer patient to dermatology due to no offices that accept Medicaid. Patient notified and she will let me know if she wants to be seen as self pay. Wendall Mola

## 2015-06-03 LAB — NEUTROPHIL OXIDATIVE BURST: % OXIDATION POSITIVE NEUTROPHILS: 97 %

## 2015-06-04 ENCOUNTER — Other Ambulatory Visit: Payer: Medicaid Other

## 2015-06-04 DIAGNOSIS — L03211 Cellulitis of face: Principal | ICD-10-CM

## 2015-06-04 DIAGNOSIS — L0201 Cutaneous abscess of face: Secondary | ICD-10-CM

## 2015-06-05 ENCOUNTER — Telehealth: Payer: Self-pay | Admitting: *Deleted

## 2015-06-05 NOTE — Telephone Encounter (Signed)
Pt called to report that she has developed a "sore throat" that started hurting last night.  Pt thinks that this is a continuation of her MRSA infection.  Pt stated that she was unable to complete the entire 3-hour IV antibiotic infusion last week due to child care issues that occurred after starting the infusion.  Note in EPIC documents the discontinuation.  Pt wanted Dr. Ninetta Lights to be aware of this new symptom.  Pt has a follow-up appointment scheduled for August.  Will call pt back if MD has a message.

## 2015-06-06 NOTE — Telephone Encounter (Signed)
Patient called stating she has developed multiple boils on her inner thighs that are painful. She wants to have the IV infusion rescheduled at [facility]. Explained that Dr. Ninetta Lights will not be back in clinic until 06/11/15 and I need a new order for this. She also said that if the insurance will not pay to have it done this soon, then she would like to know the cost out of pocket to possibly pay it herself. Called the pharmacy and the cost of the medication is between $2,700.00 and $3,100.00. There would also be the facility cost. Patient notified and I am unaware whether her insurance would pay or not; it is Medicaid.

## 2015-06-07 LAB — COMPLEMENT, TOTAL

## 2015-06-24 NOTE — Telephone Encounter (Signed)
Annice PihJackie is waiting to hear from the patient as no offices will take a medicaid patient at this time for derm and she is deciding if she wants to be self pay. Per 06/05/15 note. Do you want me to double book her anywhere on your schedule as your schedule is booked until 07/11/15? Please advise.

## 2015-06-24 NOTE — Telephone Encounter (Addendum)
Patient is very insistent that she needs to be retreated for her MRSA and would like the doctor to call her or schedule her to be seen at [facility] and have IV infusion. Advised will send the doctor a message and give her a call back once I get a response.

## 2015-06-24 NOTE — Telephone Encounter (Signed)
i will work on seeing her sooner What is the status of her derm appt?  thanks

## 2015-06-26 NOTE — Telephone Encounter (Signed)
Please schedule her with me for Monday

## 2015-06-26 NOTE — Telephone Encounter (Addendum)
Gwinda PasseMichelle Edwards, NP called requesting "work-in" appt for pt to evaluate "boils."  Scheduled pt with Dr. Daiva EvesVan Dam for 07/02/15 @ 0930.

## 2015-06-27 NOTE — Telephone Encounter (Signed)
Called the patient and scheduled her with Dr Ninetta LightsHatcher 07/01/15 at 10 am. The patient and doctor are aware.

## 2015-07-01 ENCOUNTER — Ambulatory Visit (INDEPENDENT_AMBULATORY_CARE_PROVIDER_SITE_OTHER): Payer: Medicaid Other | Admitting: Infectious Diseases

## 2015-07-01 ENCOUNTER — Encounter: Payer: Self-pay | Admitting: Infectious Diseases

## 2015-07-01 VITALS — BP 122/82 | HR 103 | Temp 97.7°F | Wt 229.0 lb

## 2015-07-01 DIAGNOSIS — L0201 Cutaneous abscess of face: Secondary | ICD-10-CM

## 2015-07-01 DIAGNOSIS — L03211 Cellulitis of face: Secondary | ICD-10-CM

## 2015-07-01 NOTE — Assessment & Plan Note (Signed)
I do not see any infected areas at this point. She needs to be seen by derm.  Hold on anbx for now.

## 2015-07-01 NOTE — Progress Notes (Signed)
   Subjective:    Patient ID: Melissa Levine, female    DOB: 11/20/1991, 24 y.o.   MRN: [id number]  HPI 24 yo F with hx of ADD, anxiety, depression, and MRSA infection on her face/thighs/shoulders since August.  She was started on bactrim 11-27-14 after she developed a boil on the L side of her neck. Cx at that time grew MSSA, MRSA (S- doxy, vanco, rif, bactrim, linezolid, clinda). Had previously been treated with doxy for R thigh staph infection.  She has previously been treated with bactroban, been instructed on how to take "bleach baths". Instructed to use antibacterial soap.   There has been some concern that she may be "picking" at the lesions from her ADD.  She was seen 6-15 in ID and sent for single dose of oritivancin. She got 1/2 of this as she had to leave for child care. She felt this didn't help.  She was also referred to Derm (not seen).  She now has small papules on her face and arms. Feels like she has a recurrence.   No new meds, soaps, shampoos last fall. Noticed after a wedding.   Review of Systems     Objective:   Physical Exam  Constitutional: She appears well-developed and well-nourished.  Skin: Rash noted. Rash is papular.      Assessment & Plan:

## 2015-07-02 ENCOUNTER — Ambulatory Visit: Payer: Medicaid Other | Admitting: Infectious Disease

## 2015-07-13 IMAGING — US US OB TRANSVAGINAL
1 series · 13 of 21 positions shown · non-contrast
Comparison: 08/26/2013

CLINICAL DATA: Severe cramping. Patient has a confirmed
intrauterine pregnancy.

EXAM:
TRANSVAGINAL OB ULTRASOUND
TECHNIQUE: Transvaginal ultrasound was performed for complete evaluation of the
gestation as well as the maternal uterus, adnexal regions, and
pelvic cul-de-sac.

[Series 1: us ob transvaginal · 21 acquisitions, 13 frames shown]
[im 1/21]
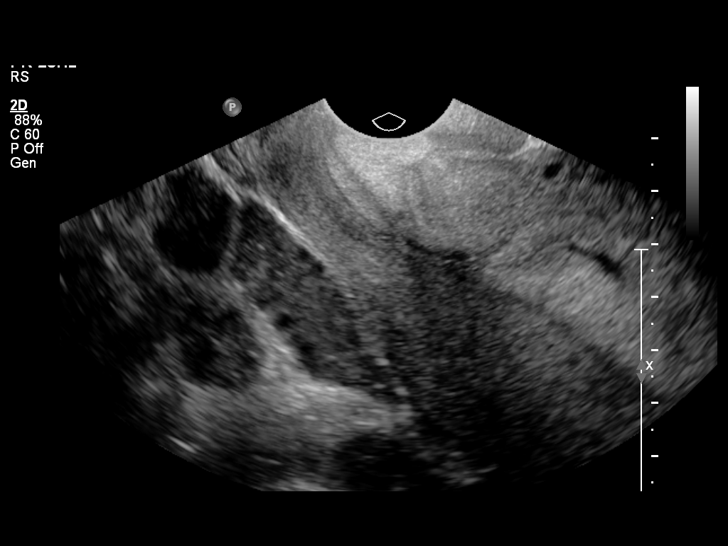
[im 3/21]
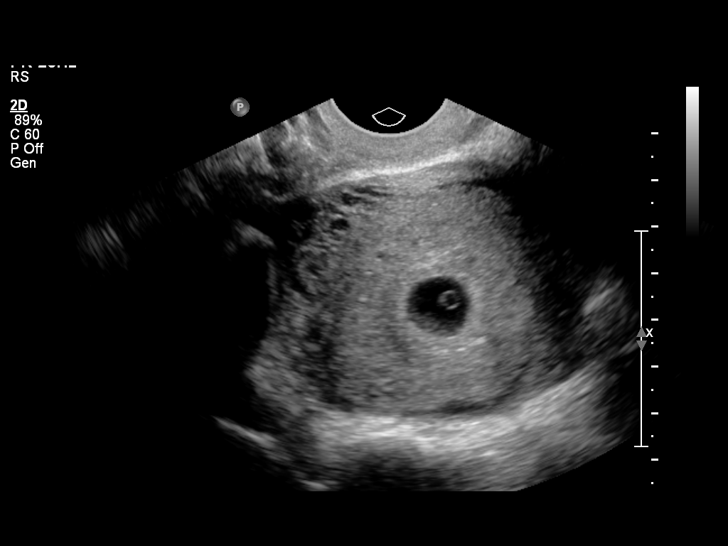
[im 5/21]
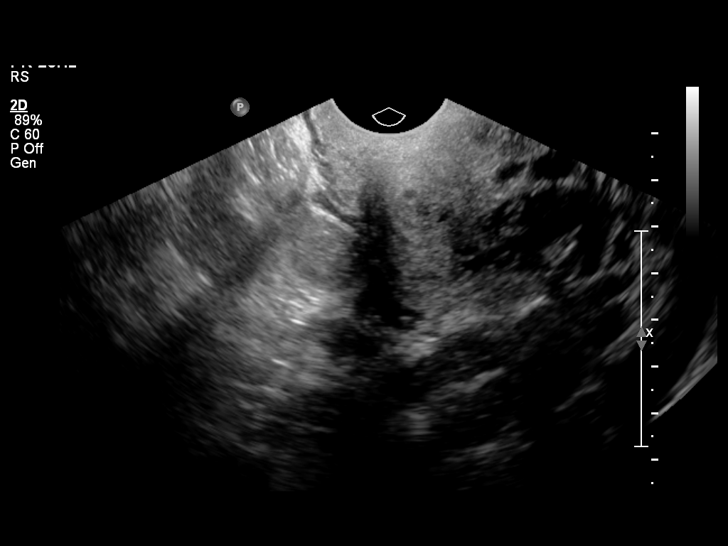
[im 6/21]
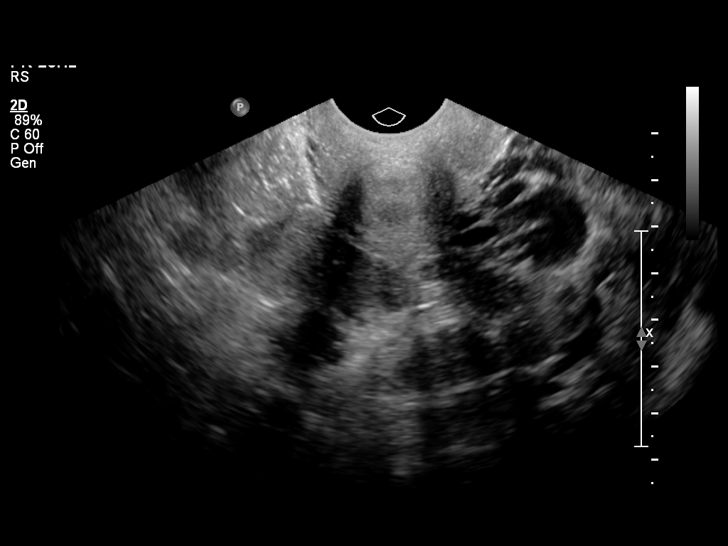
[im 8/21]
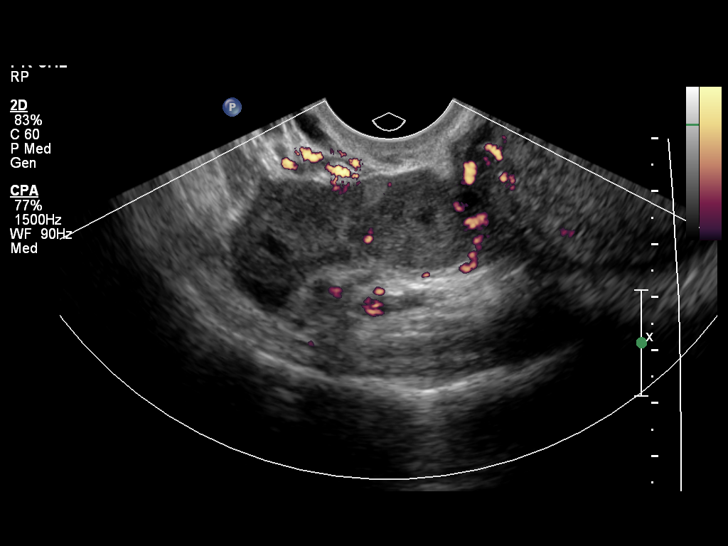
[im 9/21]
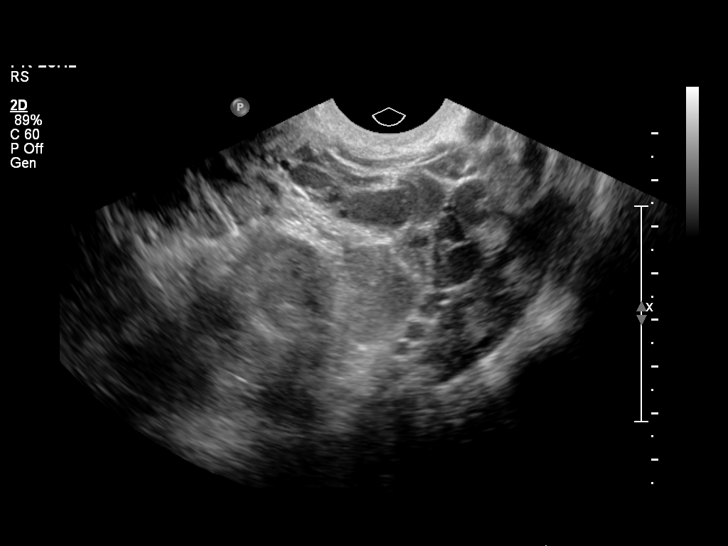
[im 11/21]
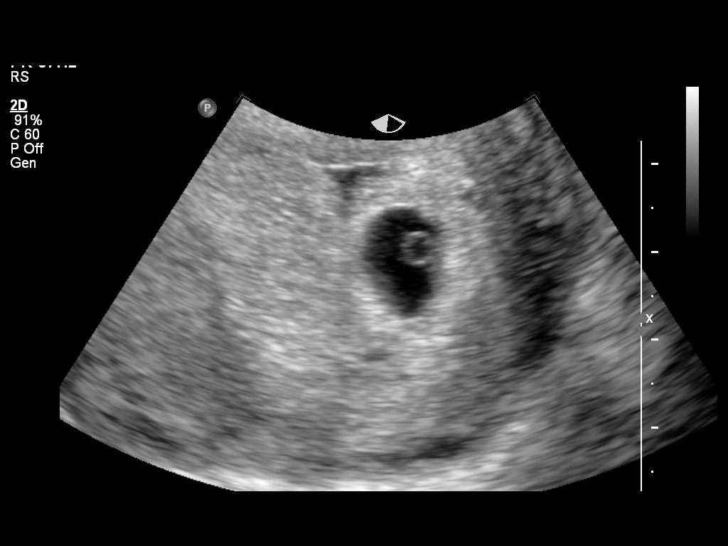
[im 13/21]
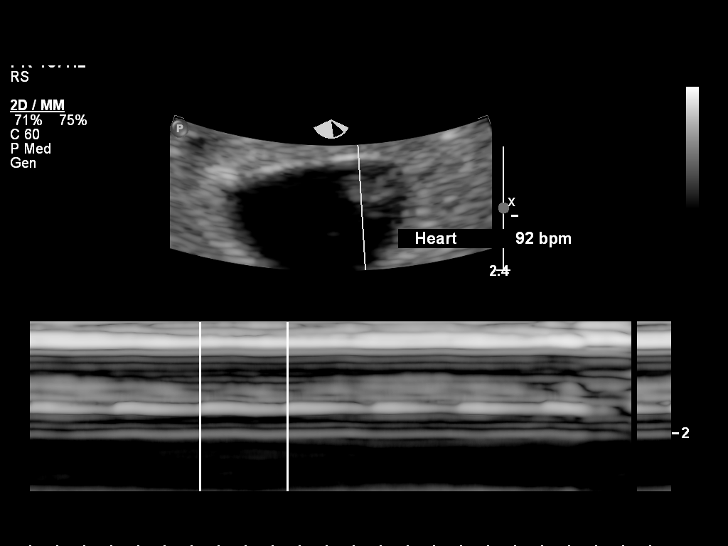
[im 14/21]
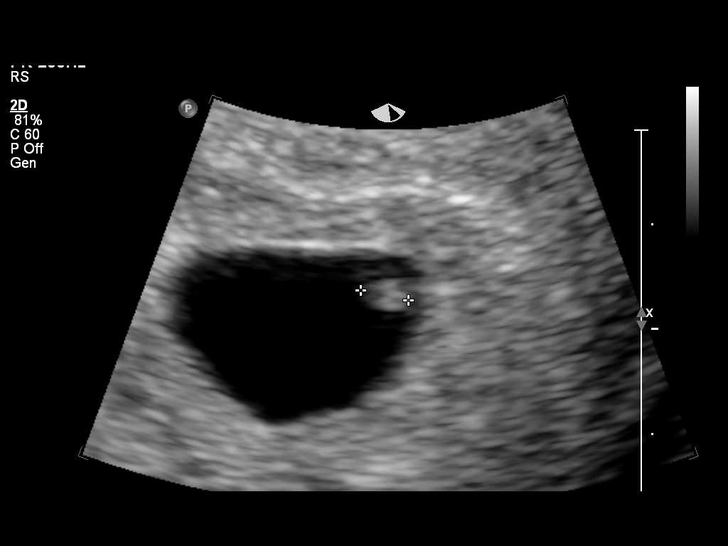
[im 16/21]
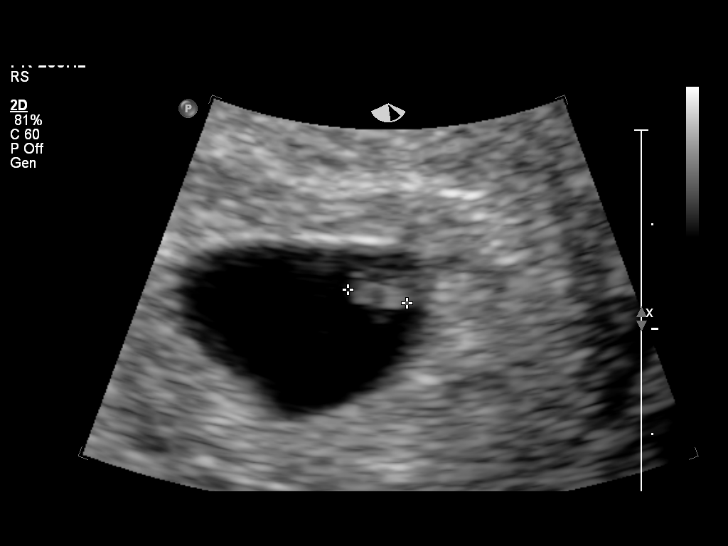
[im 17/21]
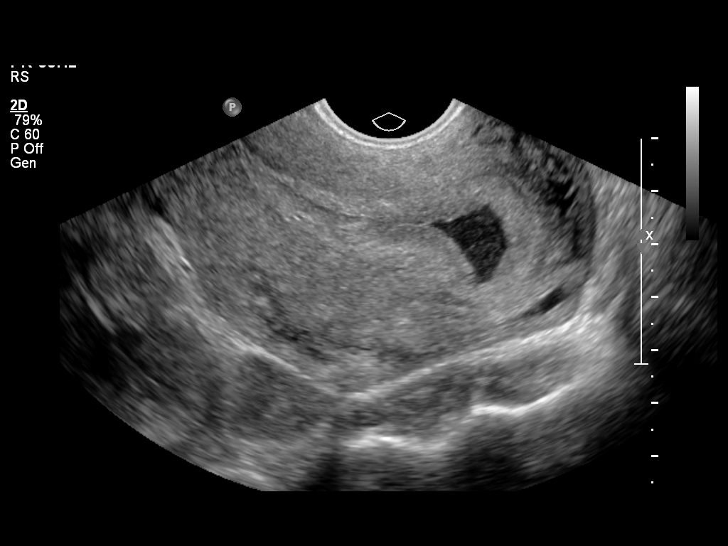
[im 19/21]
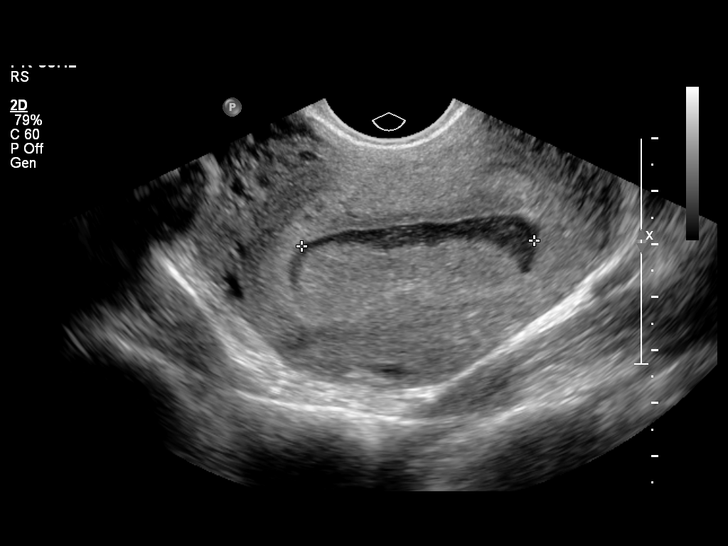
[im 21/21]
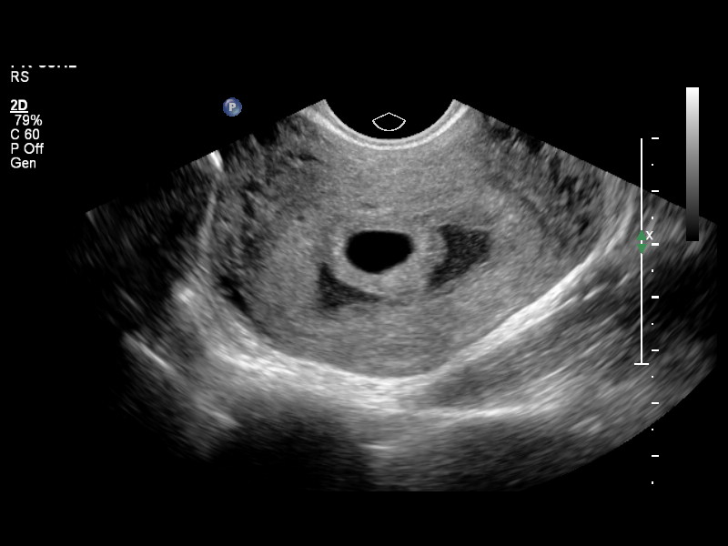

[13 of 21 positions shown; findings below may reference images not displayed]

FINDINGS: Intrauterine gestational sac: Visualized/normal in shape.

Yolk sac:  well-formed yolk sac is seen

Embryo:  Present

Cardiac Activity: Cardiac activity was identified

Heart Rate: 92 bpm

MSD:   mm    w     d

CRL:   2.6  mm   5 w 6 d                  US EDC: 04/24/2014

There is a hemorrhage bordering on the gestational sac and extends
into the endometrial canal likely a combination of subchorionic
hemorrhage, moderate size and endometrial canal hemorrhage.

Maternal uterus/adnexae: Right ovarian corpus luteum is noted. The
ovaries are otherwise unremarkable. No adnexal masses and no pelvic
free fluid.
IMPRESSION: Single live intrauterine pregnancy with a measures gestational age
of 5 weeks and 6 days showing slight interval growth since the
recent prior study.

There is a moderate size subchorionic hemorrhage extending into the
endometrial canal. This is also without significant change from the
prior study.

Ovaries and adnexa are unremarkable. No pelvic free fluid.

## 2015-08-05 ENCOUNTER — Ambulatory Visit: Payer: Medicaid Other | Admitting: Infectious Diseases

## 2015-12-12 ENCOUNTER — Emergency Department (HOSPITAL_COMMUNITY)
Admission: EM | Admit: 2015-12-12 | Discharge: 2015-12-12 | Disposition: A | Discharge status: Home / Self Care | Payer: Medicaid Other | Attending: Emergency Medicine | Admitting: Emergency Medicine

## 2015-12-12 ENCOUNTER — Encounter (HOSPITAL_COMMUNITY): Payer: Self-pay | Admitting: *Deleted

## 2015-12-12 DIAGNOSIS — F419 Anxiety disorder, unspecified: Secondary | ICD-10-CM

## 2015-12-12 DIAGNOSIS — Z79899 Other long term (current) drug therapy: Secondary | ICD-10-CM | POA: Diagnosis not present

## 2015-12-12 DIAGNOSIS — Z872 Personal history of diseases of the skin and subcutaneous tissue: Secondary | ICD-10-CM | POA: Diagnosis not present

## 2015-12-12 DIAGNOSIS — Z8614 Personal history of Methicillin resistant Staphylococcus aureus infection: Secondary | ICD-10-CM | POA: Diagnosis not present

## 2015-12-12 DIAGNOSIS — F329 Major depressive disorder, single episode, unspecified: Secondary | ICD-10-CM | POA: Diagnosis present

## 2015-12-12 DIAGNOSIS — F17210 Nicotine dependence, cigarettes, uncomplicated: Secondary | ICD-10-CM | POA: Insufficient documentation

## 2015-12-12 DIAGNOSIS — F3130 Bipolar disorder, current episode depressed, mild or moderate severity, unspecified: Secondary | ICD-10-CM | POA: Insufficient documentation

## 2015-12-12 DIAGNOSIS — Z8742 Personal history of other diseases of the female genital tract: Secondary | ICD-10-CM | POA: Insufficient documentation

## 2015-12-12 DIAGNOSIS — Z862 Personal history of diseases of the blood and blood-forming organs and certain disorders involving the immune mechanism: Secondary | ICD-10-CM | POA: Diagnosis not present

## 2015-12-12 DIAGNOSIS — F319 Bipolar disorder, unspecified: Secondary | ICD-10-CM

## 2015-12-12 DIAGNOSIS — F909 Attention-deficit hyperactivity disorder, unspecified type: Secondary | ICD-10-CM | POA: Insufficient documentation

## 2015-12-12 DIAGNOSIS — Z3202 Encounter for pregnancy test, result negative: Secondary | ICD-10-CM | POA: Diagnosis not present

## 2015-12-12 DIAGNOSIS — F410 Panic disorder [episodic paroxysmal anxiety] without agoraphobia: Secondary | ICD-10-CM | POA: Diagnosis not present

## 2015-12-12 DIAGNOSIS — F1510 Other stimulant abuse, uncomplicated: Secondary | ICD-10-CM | POA: Insufficient documentation

## 2015-12-12 LAB — RAPID URINE DRUG SCREEN, HOSP PERFORMED
Amphetamines: POSITIVE — AB
BARBITURATES: NOT DETECTED
BENZODIAZEPINES: NOT DETECTED
COCAINE: NOT DETECTED
Opiates: NOT DETECTED
Tetrahydrocannabinol: NOT DETECTED

## 2015-12-12 LAB — COMPREHENSIVE METABOLIC PANEL
ALK PHOS: 83 U/L (ref 38–126)
ALT: 21 U/L (ref 14–54)
ANION GAP: 10 (ref 5–15)
AST: 21 U/L (ref 15–41)
Albumin: 4.3 g/dL (ref 3.5–5.0)
BUN: 11 mg/dL (ref 6–20)
CHLORIDE: 104 mmol/L (ref 101–111)
CO2: 23 mmol/L (ref 22–32)
Calcium: 9.5 mg/dL (ref 8.9–10.3)
Creatinine, Ser: 0.53 mg/dL (ref 0.44–1.00)
GFR calc non Af Amer: >60 mL/min (ref >60)
Glucose, Bld: 129 mg/dL — ABNORMAL HIGH (ref 65–99)
Potassium: 4 mmol/L (ref 3.5–5.1)
Sodium: 137 mmol/L (ref 135–145)
Total Bilirubin: 0.8 mg/dL (ref 0.3–1.2)
Total Protein: 7.5 g/dL (ref 6.5–8.1)

## 2015-12-12 LAB — CBC
HEMATOCRIT: 44.6 % (ref 36.0–46.0)
Hemoglobin: 14.4 g/dL (ref 12.0–15.0)
MCH: 28.7 pg (ref 26.0–34.0)
MCHC: 32.3 g/dL (ref 30.0–36.0)
MCV: 89 fL (ref 78.0–100.0)
PLATELETS: 259 10*3/uL (ref 150–400)
RBC: 5.01 MIL/uL (ref 3.87–5.11)
RDW: 13.1 % (ref 11.5–15.5)
WBC: 12.6 10*3/uL — AB (ref 4.0–10.5)

## 2015-12-12 LAB — POC URINE PREG, ED: PREG TEST UR: NEGATIVE

## 2015-12-12 LAB — SALICYLATE LEVEL: Salicylate Lvl: <4 mg/dL (ref 2.8–30.0)

## 2015-12-12 LAB — ETHANOL

## 2015-12-12 LAB — ACETAMINOPHEN LEVEL

## 2015-12-12 MED ORDER — LORAZEPAM 1 MG PO TABS: 1.0000 mg | ORAL_TABLET | Freq: Three times a day (TID) | ORAL | Status: DC | PRN

## 2015-12-12 MED ORDER — LORAZEPAM 1 MG PO TABS
1.0000 mg | ORAL_TABLET | Freq: Once | ORAL | Status: DC
  Filled 2015-12-12: qty 1

## 2015-12-12 MED ORDER — ACETAMINOPHEN 325 MG PO TABS: 650.0000 mg | ORAL_TABLET | ORAL | Status: DC | PRN

## 2015-12-12 MED ORDER — LORAZEPAM 1 MG PO TABS
1.0000 mg | ORAL_TABLET | Freq: Three times a day (TID) | ORAL | Status: DC | PRN
  Administered 2015-12-12: 1 mg via ORAL

## 2015-12-12 MED ORDER — IBUPROFEN 200 MG PO TABS: 600.0000 mg | ORAL_TABLET | Freq: Three times a day (TID) | ORAL | Status: DC | PRN

## 2015-12-12 MED ORDER — NICOTINE 21 MG/24HR TD PT24
21.0000 mg | MEDICATED_PATCH | Freq: Every day | TRANSDERMAL | Status: DC
Start: 2015-12-12 — End: 2015-12-12

## 2015-12-12 NOTE — ED Notes (Addendum)
Pt states she woke up this am and felt overwhelmed and did not know hwo she would get through the day. She has a 111.26 year old daughter and a 26 year old son. Pt phoned her mom and mom brought her into the ER. Pt does appear calmer now after getting ativan in the ER. Pt would like to be seen by her private MD and would like to be discharged.

## 2015-12-12 NOTE — BH Assessment (Signed)
Assessment Note  Melissa Levine is an 24 y.o. female with history of ADHD, Anxiety, and Depression. Patient brought to [facility] by her mother. She presents with increased anxiety associated with symptoms of a panic attack. Upon arrival to the ED patient was sobbing. states she woke up this am and felt overwhelmed and did not know hwo she would get through the day. Patient is prescribed Cymbalta and Adderall. Patient feels that her Adderall dosage should be increased because of weight gain (70 pounds). Patient has seen many medical providers and hasn't found a doctor willing to increase her Adderall to date. Sts that instead they offer her other medications such Vyvanse. Patient does not want to try Vyvanse. Mother at bedside reports that her daughter over takes her Adderall and it's all consumed in 15 days instead of 30 days. Patient was also recently started on a new Bipolar medication (Varaycor?). She has has taken this new medication for the past 2 weeks.   Patient denies SI. No previous suicide attempts or gestures. She reports no history of self mutilating behaviors.She denies HI and AVH's. Patient does admit to feeling depressed, hopeless, and experiences loss of interest in usual pleasures.   She is currently seeking mental health services with psychiatrist (Dr. Andee Poles). Patient has no history of inpatient hospitalizations.   Diagnosis: ADHD, Anxiety, and Depression  Past Medical History:  Past Medical History  Diagnosis Date  . ADHD (attention deficit hyperactivity disorder)   . Panic attacks   . Depression   . BV (bacterial vaginosis)   . Anemia   . MRSA cellulitis     Past Surgical History  Procedure Laterality Date  . Induced abortion      Family History:  Family History  Problem Relation Age of Onset  . Diabetes Brother   . Gallstones Mother     Social History:  reports that she has been smoking Cigarettes.  She has been smoking about 1.00 pack per day. She has  never used smokeless tobacco. She reports that she does not drink alcohol or use illicit drugs.  Additional Social History:  Alcohol / Drug Use Pain Medications: SE MAR Prescriptions: SEE MAR Over the Counter: SEE MAR History of alcohol / drug use?: No history of alcohol / drug abuse  CIWA: CIWA-Ar BP: 129/85 mmHg Pulse Rate: (!) 131 COWS:    Allergies: No Known Allergies  Home Medications:  (Not in a hospital admission)  OB/GYN Status:  Patient's last menstrual period was 11/28/2015.  General Assessment Data Location of Assessment: [facility] TTS Assessment: In system Is this a Tele or Face-to-Face Assessment?: Face-to-Face Is this an Initial Assessment or a Re-assessment for this encounter?: Initial Assessment Marital status: Single Maiden name:  Materials engineer) Is patient pregnant?: No Pregnancy Status: No Living Arrangements: Other (Comment), Parent, Children (mother and 2 children) Can pt return to current living arrangement?: Yes Admission Status: Voluntary Is patient capable of signing voluntary admission?: Yes Referral Source: Self/Family/Friend Insurance type:  (MCD)     Crisis Care Plan Living Arrangements: Other (Comment), Parent, Children (mother and 2 children) Legal Guardian:  (No guardian ) Name of Psychiatrist:  [facility]) Name of Therapist:  (none reported)  Education Status Is patient currently in school?: No Current Grade:  (n/a) Highest grade of school patient has completed:  (n/a) Name of school:  (n/a)  Risk to self with the past 6 months Suicidal Ideation: No Has patient been a risk to self within the past 6 months prior to admission? :  No Suicidal Intent: No Has patient had any suicidal intent within the past 6 months prior to admission? : No Is patient at risk for suicide?: No Suicidal Plan?: No Has patient had any suicidal plan within the past 6 months prior to admission? : No Access to Means: No What has been your use of drugs/alcohol  within the last 12 months?:  (patient denies ) Previous Attempts/Gestures: No How many times?:  (0) Other Self Harm Risks:  (0) Triggers for Past Attempts:  (no previous attempts or gestures) Intentional Self Injurious Behavior: None Family Suicide History: No Recent stressful life event(s): Other (Comment) ("I feel like I'm not a good mother") Persecutory voices/beliefs?: No Depression: Yes Depression Symptoms: Feeling angry/irritable, Feeling worthless/self pity, Loss of interest in usual pleasures, Tearfulness, Isolating, Fatigue Substance abuse history and/or treatment for substance abuse?: No Suicide prevention information given to non-admitted patients: Not applicable  Risk to Others within the past 6 months Homicidal Ideation: No Does patient have any lifetime risk of violence toward others beyond the six months prior to admission? : No Thoughts of Harm to Others: No Current Homicidal Intent: No Current Homicidal Plan: No Access to Homicidal Means: No Identified Victim:  (n/a) History of harm to others?: No Assessment of Violence: None Noted Violent Behavior Description:  (patient is calm and cooperative ) Does patient have access to weapons?: No Criminal Charges Pending?: No Does patient have a court date: No Is patient on probation?: No  Psychosis Hallucinations: None noted Delusions: None noted  Mental Status Report Appearance/Hygiene: Disheveled Eye Contact: Good Motor Activity: Freedom of movement Speech: Logical/coherent Level of Consciousness: Alert Mood: Depressed Affect: Appropriate to circumstance Anxiety Level: None Thought Processes: Coherent, Relevant Judgement: Impaired Orientation: Person, Place, Situation, Time Obsessive Compulsive Thoughts/Behaviors: None  Cognitive Functioning Concentration: Decreased Memory: Recent Intact, Remote Intact IQ: Average Insight: Poor Impulse Control: Good Appetite: Good Weight Loss:  (none reported) Weight  Gain:  (none reported) Sleep: Decreased Total Hours of Sleep:  (varies) Vegetative Symptoms: None  ADLScreening [facility] Assessment Services) Patient's cognitive ability adequate to safely complete daily activities?: Yes Patient able to express need for assistance with ADLs?: Yes Independently performs ADLs?: Yes (appropriate for developmental age)  Prior Inpatient Therapy Prior Inpatient Therapy: No Prior Therapy Dates:  (n/a) Prior Therapy Facilty/Provider(s):  (n/a) Reason for Treatment:  (n/a)  Prior Outpatient Therapy Prior Outpatient Therapy: Yes Prior Therapy Dates:  (current) Prior Therapy Facilty/Provider(s):  [facility]) Reason for Treatment:  (med managment, Bipolar Disorder, Depression) Does patient have an ACCT team?: No Does patient have Intensive In-House Services?  : No Does patient have [facility] services? : No Does patient have P4CC services?: No  ADL Screening (condition at time of admission) Patient's cognitive ability adequate to safely complete daily activities?: Yes Is the patient deaf or have difficulty hearing?: No Does the patient have difficulty seeing, even when wearing glasses/contacts?: No Does the patient have difficulty concentrating, remembering, or making decisions?: No Patient able to express need for assistance with ADLs?: Yes Does the patient have difficulty dressing or bathing?: No Independently performs ADLs?: Yes (appropriate for developmental age) Does the patient have difficulty walking or climbing stairs?: No Weakness of Legs: None Weakness of Arms/Hands: None  Home Assistive Devices/Equipment Home Assistive Devices/Equipment: None    Abuse/Neglect Assessment (Assessment to be complete while patient is alone) Physical Abuse: Denies Verbal Abuse: Denies Sexual Abuse: Denies Exploitation of patient/patient's resources: Denies Self-Neglect: Denies Values / Beliefs Cultural Requests During Hospitalization: None Spiritual  Requests During  Hospitalization: None   Advance Directives (For Healthcare) Does patient have an advance directive?: No Would patient like information on creating an advanced directive?: No - patient declined information    Additional Information 1:1 In Past 12 Months?: No CIRT Risk: No Elopement Risk: No Does patient have medical clearance?: Yes     Disposition:  Disposition Initial Assessment Completed for this Encounter: Yes (Patient will follow up with Andee [facility] for med manageme) Disposition of Patient: Treatment offered and refused (Observation Unit, Psych IOP, & Outpt referral w/ MCD provide)  On Site Evaluation by:   Reviewed with Physician:    Octaviano BattyPerry, Zora Glendenning Mona 12/12/2015 12:29 PM

## 2015-12-12 NOTE — ED Notes (Signed)
Bed: [id number] Expected date:  Expected time:  Means of arrival:  Comments: [place]

## 2015-12-12 NOTE — Discharge Instructions (Signed)
It was our pleasure to provide your ER care today - we hope that you feel better.  Rest. Drink adequate fluids.  You may take ativan as need for anxiety - no driving when taking.  Follow up with your therapist/psychiatrist in the coming week for recheck.  If mental health crisis, go directly to [facility].  Return to ER if worse, new symptoms, medical emergency, other concern.  You were given medication in the ER - no driving for the next 4 hours.    Generalized Anxiety Disorder Generalized anxiety disorder (GAD) is a mental disorder. It interferes with life functions, including relationships, work, and school. GAD is different from normal anxiety, which everyone experiences at some point in their lives in response to specific life events and activities. Normal anxiety actually helps [place] prepare for and get through these life events and activities. Normal anxiety goes away after the event or activity is over.  GAD causes anxiety that is not necessarily related to specific events or activities. It also causes excess anxiety in proportion to specific events or activities. The anxiety associated with GAD is also difficult to control. GAD can vary from mild to severe. People with severe GAD can have intense waves of anxiety with physical symptoms (panic attacks).  SYMPTOMS The anxiety and worry associated with GAD are difficult to control. This anxiety and worry are related to many life events and activities and also occur more days than not for 6 months or longer. People with GAD also have three or more of the following symptoms (one or more in children):  Restlessness.   Fatigue.  Difficulty concentrating.   Irritability.  Muscle tension.  Difficulty sleeping or unsatisfying sleep. DIAGNOSIS GAD is diagnosed through an assessment by your health care provider. Your health care provider will ask you questions aboutyour mood,physical symptoms, and events in your life. Your health care  provider may ask you about your medical history and use of alcohol or drugs, including prescription medicines. Your health care provider may also do a physical exam and blood tests. Certain medical conditions and the use of certain substances can cause symptoms similar to those associated with GAD. Your health care provider may refer you to a mental health specialist for further evaluation. TREATMENT The following therapies are usually used to treat GAD:   Medication. Antidepressant medication usually is prescribed for long-term daily control. Antianxiety medicines may be added in severe cases, especially when panic attacks occur.   Talk therapy (psychotherapy). Certain types of talk therapy can be helpful in treating GAD by providing support, education, and guidance. A form of talk therapy called cognitive behavioral therapy can teach you healthy ways to think about and react to daily life events and activities.  Stress managementtechniques. These include yoga, meditation, and exercise and can be very helpful when they are practiced regularly. A mental health specialist can help determine which treatment is best for you. Some people see improvement with one therapy. However, other people require a combination of therapies.   This information is not intended to replace advice given to you by your health care provider. Make sure you discuss any questions you have with your health care provider.   Document Released: 03/27/2013 Document Revised: 12/21/2014 Document Reviewed: 03/27/2013 Elsevier Interactive Patient Education 2016 Elsevier Inc[employer]     Bipolar Disorder Bipolar disorder is a mental illness. The term bipolar disorder actually is used to describe a group of disorders that all share varying degrees of emotional highs and lows that can  interfere with daily functioning, such as work, school, or relationships. Bipolar disorder also can lead to drug abuse, hospitalization, and suicide. The  emotional highs of bipolar disorder are periods of elation or irritability and high energy. These highs can range from a mild form (hypomania) to a severe form (mania). People experiencing episodes of hypomania may appear energetic, excitable, and highly productive. People experiencing mania may behave impulsively or erratically. They often make poor decisions. They may have difficulty sleeping. The most severe episodes of mania can involve having very distorted beliefs or perceptions about the world and seeing or hearing things that are not real (psychotic delusions and hallucinations).  The emotional lows of bipolar disorder (depression) also can range from mild to severe. Severe episodes of bipolar depression can involve psychotic delusions and hallucinations. Sometimes people with bipolar disorder experience a state of mixed mood. Symptoms of hypomania or mania and depression are both present during this mixed-mood episode. SIGNS AND SYMPTOMS There are signs and symptoms of the episodes of hypomania and mania as well as the episodes of depression. The signs and symptoms of hypomania and mania are similar but vary in severity. They include:  Inflated self-esteem or feeling of increased self-confidence.  Decreased need for sleep.  Unusual talkativeness (rapid or pressured speech) or the feeling of a need to keep talking.  Sensation of racing thoughts or constant talking, with quick shifts between topics that may or may not be related (flight of ideas).  Decreased ability to focus or concentrate.  Increased purposeful activity, such as work, studies, or social activity, or nonproductive activity, such as pacing, squirming and fidgeting, or finger and toe tapping.  Impulsive behavior and use of poor judgment, resulting in high-risk activities, such as having unprotected sex or spending excessive amounts of money. Signs and symptoms of depression include the following:   Feelings of sadness,  hopelessness, or helplessness.  Frequent or uncontrollable episodes of crying.  Lack of feeling anything or caring about anything.  Difficulty sleeping or sleeping too much.  Inability to enjoy the things you used to enjoy.   Desire to be alone all the time.   Feelings of guilt or worthlessness.  Lack of energy or motivation.   Difficulty concentrating, remembering, or making decisions.  Change in appetite or weight beyond normal fluctuations.  Thoughts of death or the desire to harm yourself. DIAGNOSIS  Bipolar disorder is diagnosed through an assessment by your caregiver. Your caregiver will ask questions about your emotional episodes. There are two main types of bipolar disorder. People with type I bipolar disorder have manic episodes with or without depressive episodes. People with type II bipolar disorder have hypomanic episodes and major depressive episodes, which are more serious than mild depression. The type of bipolar disorder you have can make an important difference in how your illness is monitored and treated. Your caregiver may ask questions about your medical history and use of alcohol or drugs, including prescription medication. Certain medical conditions and substances also can cause emotional highs and lows that resemble bipolar disorder (secondary bipolar disorder).  TREATMENT  Bipolar disorder is a long-term illness. It is best controlled with continuous treatment rather than treatment only when symptoms occur. The following treatments can be prescribed for bipolar disorders:  Medication--Medication can be prescribed by a doctor that is an expert in treating mental disorders (psychiatrists). Medications called mood stabilizers are usually prescribed to help control the illness. Other medications are sometimes added if symptoms of mania, depression, or psychotic delusions  and hallucinations occur despite the use of a mood stabilizer.  Talk therapy--Some forms of  talk therapy are helpful in providing support, education, and guidance. A combination of medication and talk therapy is best for managing the disorder over time. A procedure in which electricity is applied to your brain through your scalp (electroconvulsive therapy) is used in cases of severe mania when medication and talk therapy do not work or work too slowly.   This information is not intended to replace advice given to you by your health care provider. Make sure you discuss any questions you have with your health care provider.   Document Released: 03/08/2001 Document Revised: 12/21/2014 Document Reviewed: 12/26/2012 Elsevier Interactive Patient Education 2016 [employer] and Stress Management Stress is a normal reaction to life events. It is what you feel when life demands more than you are used to or more than you can handle. Some stress can be useful. For example, the stress reaction can help you catch the last bus of the day, study for a test, or meet a deadline at work. But stress that occurs too often or for too long can cause problems. It can affect your emotional health and interfere with relationships and normal daily activities. Too much stress can weaken your immune system and increase your risk for physical illness. If you already have a medical problem, stress can make it worse. CAUSES  All sorts of life events may cause stress. An event that causes stress for one person may not be stressful for another person. Major life events commonly cause stress. These may be positive or negative. Examples include losing your job, moving into a new home, getting married, having a baby, or losing a loved one. Less obvious life events may also cause stress, especially if they occur day after day or in combination. Examples include working long hours, driving in traffic, caring for children, being in debt, or being in a difficult relationship. SIGNS AND SYMPTOMS Stress may cause  emotional symptoms including, the following:  Anxiety. This is feeling worried, afraid, on edge, overwhelmed, or out of control.  Anger. This is feeling irritated or impatient.  Depression. This is feeling sad, down, helpless, or guilty.  Difficulty focusing, remembering, or making decisions. Stress may cause physical symptoms, including the following:   Aches and pains. These may affect your head, neck, back, stomach, or other areas of your body.  Tight muscles or clenched jaw.  Low energy or trouble sleeping. Stress may cause unhealthy behaviors, including the following:   Eating to feel better (overeating) or skipping meals.  Sleeping too little, too much, or both.  Working too much or putting off tasks (procrastination).  Smoking, drinking alcohol, or using drugs to feel better. DIAGNOSIS  Stress is diagnosed through an assessment by your health care provider. Your health care provider will ask questions about your symptoms and any stressful life events.Your health care provider will also ask about your medical history and may order blood tests or other tests. Certain medical conditions and medicine can cause physical symptoms similar to stress. Mental illness can cause emotional symptoms and unhealthy behaviors similar to stress. Your health care provider may refer you to a mental health professional for further evaluation.  TREATMENT  Stress management is the recommended treatment for stress.The goals of stress management are reducing stressful life events and coping with stress in healthy ways.  Techniques for reducing stressful life events include the following:  Stress identification. Self-monitor  for stress and identify what causes stress for you. These skills may help you to avoid some stressful events.  Time management. Set your priorities, keep a calendar of events, and learn to say "no." These tools can help you avoid making too many commitments. Techniques for  coping with stress include the following:  Rethinking the problem. Try to think realistically about stressful events rather than ignoring them or overreacting. Try to find the positives in a stressful situation rather than focusing on the negatives.  Exercise. Physical exercise can release both physical and emotional tension. The key is to find a form of exercise you enjoy and do it regularly.  Relaxation techniques. These relax the body and mind. Examples include yoga, meditation, tai chi, biofeedback, deep breathing, progressive muscle relaxation, listening to music, being out in nature, journaling, and other hobbies. Again, the key is to find one or more that you enjoy and can do regularly.  Healthy lifestyle. Eat a balanced diet, get plenty of sleep, and do not smoke. Avoid using alcohol or drugs to relax.  Strong support network. Spend time with family, friends, or other people you enjoy being around.Express your feelings and talk things over with someone you trust. Counseling or talktherapy with a mental health professional may be helpful if you are having difficulty managing stress on your own. Medicine is typically not recommended for the treatment of stress.Talk to your health care provider if you think you need medicine for symptoms of stress. HOME CARE INSTRUCTIONS  Keep all follow-up visits as directed by your health care provider.  Take all medicines as directed by your health care provider. SEEK MEDICAL CARE IF:  Your symptoms get worse or you start having new symptoms.  You feel overwhelmed by your problems and can no longer manage them on your own. SEEK IMMEDIATE MEDICAL CARE IF:  You feel like hurting yourself or someone else.   This information is not intended to replace advice given to you by your health care provider. Make sure you discuss any questions you have with your health care provider.   Document Released: 05/26/2001 Document Revised: 12/21/2014 Document  Reviewed: 07/25/2013 Elsevier Interactive Patient Education [employer].

## 2015-12-12 NOTE — ED Notes (Signed)
Mother took all belongings to car

## 2015-12-12 NOTE — ED Provider Notes (Signed)
CSN: [id number]     Arrival date & time 12/12/15  1004 History   First MD Initiated Contact with Patient 12/12/15 1027     Chief Complaint  Patient presents with  . Depression     (Consider location/radiation/quality/duration/timing/severity/associated sxs/prior Treatment) Patient is a 24 y.o. female presenting with depression. The history is provided by the patient.  Depression Pertinent negatives include no chest pain, no abdominal pain, no headaches and no shortness of breath.  Patient w hx anxiety, bipolar, presents w increased feelings of depression in past week. Pt indicates for her depression, on celexa seemed to help for awhile, but was taken off when was pregnant, and then when restarted it didn't help.  Currently is on cymbalta which she does not feel helps.  Feels stressed and depressed.  Takes med for adhd, but currently out.  Denies etoh or drug abuse. Denies suicidal thoughts or plan to hurt self or others. No recent febrile illness or acute physical health problem is eating/drinking. Does have some trouble sleeping at night.      Past Medical History  Diagnosis Date  . ADHD (attention deficit hyperactivity disorder)   . Panic attacks   . Depression   . BV (bacterial vaginosis)   . Anemia   . MRSA cellulitis    Past Surgical History  Procedure Laterality Date  . Induced abortion     Family History  Problem Relation Age of Onset  . Diabetes Brother   . Gallstones Mother    Social History  Substance Use Topics  . Smoking status: Current Every Day Smoker -- 1.00 packs/day    Types: Cigarettes  . Smokeless tobacco: Never Used  . Alcohol Use: No   OB History    Gravida Para Term Preterm AB TAB SAB Ectopic Multiple Living   3 2 2  0 1 1 0 0 0 2     Review of Systems  Constitutional: Negative for fever and chills.  HENT: Negative for sore throat.   Eyes: Negative for redness.  Respiratory: Negative for shortness of breath.   Cardiovascular: Negative for  chest pain.  Gastrointestinal: Negative for abdominal pain.  Genitourinary: Negative for flank pain.  Musculoskeletal: Negative for back pain and neck pain.  Skin: Negative for pallor.  Neurological: Negative for headaches.  Hematological: Does not bruise/bleed easily.  Psychiatric/Behavioral: Positive for depression and dysphoric mood. The patient is nervous/anxious.       Allergies  Review of patient's allergies indicates no known allergies.  Home Medications   Prior to Admission medications   Medication Sig Start Date End Date Taking? Authorizing Provider  amphetamine-dextroamphetamine (ADDERALL) 20 MG tablet Take 20 mg by mouth 3 (three) times daily.    Historical Provider, MD  Chlorhexidine Gluconate 2 % LIQD Apply 1 Tube topically once a week. Patient not taking: Reported on 05/29/2015 01/08/15   Ginnie SmartJeffrey C Hatcher, MD  DULoxetine (CYMBALTA) 20 MG capsule Take 20 mg by mouth 2 (two) times daily.    Historical Provider, MD  Oritavancin Diphosphate (ORBACTIV) 400 MG SOLR Inject 1,200 mg into the vein once. Patient not taking: Reported on 07/01/2015 05/29/15   Ginnie SmartJeffrey C Hatcher, MD   BP 129/85 mmHg  Pulse 131  Temp(Src) 97.8 F (36.6 C) (Oral)  Resp 20  SpO2 93%  LMP 11/28/2015 Physical Exam  Constitutional: She appears well-developed and well-nourished.  Eyes: Conjunctivae are normal. No scleral icterus.  Neck: Neck supple. No tracheal deviation present.  Cardiovascular: Normal rate, regular rhythm, normal heart sounds and  intact distal pulses.   Pulmonary/Chest: Effort normal and breath sounds normal. No respiratory distress.  Abdominal: Normal appearance. She exhibits no distension. There is no tenderness.  Musculoskeletal: She exhibits no edema.  Neurological: She is alert.  Speech clear, fluent. Ambulates w steady gait.   Skin: Skin is warm and dry. No rash noted.  Psychiatric:  Anxious, tearful. Denies SI.   Nursing note and vitals reviewed.   ED Course   Procedures (including critical care time) Labs Review  Results for orders placed or performed during the hospital encounter of 12/12/15  Comprehensive metabolic panel  Result Value Ref Range   Sodium 137 135 - 145 mmol/L   Potassium 4.0 3.5 - 5.1 mmol/L   Chloride 104 101 - 111 mmol/L   CO2 23 22 - 32 mmol/L   Glucose, Bld 129 (H) 65 - 99 mg/dL   BUN 11 6 - 20 mg/dL   Creatinine, Ser 4.09 0.44 - 1.00 mg/dL   Calcium 9.5 8.9 - 81.1 mg/dL   Total Protein 7.5 6.5 - 8.1 g/dL   Albumin 4.3 3.5 - 5.0 g/dL   AST 21 15 - 41 U/L   ALT 21 14 - 54 U/L   Alkaline Phosphatase 83 38 - 126 U/L   Total Bilirubin 0.8 0.3 - 1.2 mg/dL   GFR calc non Af Amer >60 >60 mL/min   GFR calc Af Amer >60 >60 mL/min   Anion gap 10 5 - 15  Ethanol (ETOH)  Result Value Ref Range   Alcohol, Ethyl (B) <5 <5 mg/dL  Salicylate level  Result Value Ref Range   Salicylate Lvl <4.0 2.8 - 30.0 mg/dL  Acetaminophen level  Result Value Ref Range   Acetaminophen (Tylenol), Serum <10 (L) 10 - 30 ug/mL  CBC  Result Value Ref Range   WBC 12.6 (H) 4.0 - 10.5 K/uL   RBC 5.01 3.87 - 5.11 MIL/uL   Hemoglobin 14.4 12.0 - 15.0 g/dL   HCT 91.4 78.2 - 95.6 %   MCV 89.0 78.0 - 100.0 fL   MCH 28.7 26.0 - 34.0 pg   MCHC 32.3 30.0 - 36.0 g/dL   RDW 21.3 08.6 - 57.8 %   Platelets 259 150 - 400 K/uL  Urine rapid drug screen (hosp performed) (Not at [facility])  Result Value Ref Range   Opiates NONE DETECTED NONE DETECTED   Cocaine NONE DETECTED NONE DETECTED   Benzodiazepines NONE DETECTED NONE DETECTED   Amphetamines POSITIVE (A) NONE DETECTED   Tetrahydrocannabinol NONE DETECTED NONE DETECTED   Barbiturates NONE DETECTED NONE DETECTED  POC urine preg, ED (not at [facility])  Result Value Ref Range   Preg Test, Ur NEGATIVE NEGATIVE     I have personally reviewed and evaluated these images and lab results as part of my medical decision-making.   EKG Interpretation   Date/Time:  Thursday December 12 2015 10:16:45  EST Ventricular Rate:  110 PR Interval:  141 QRS Duration: 98 QT Interval:  328 QTC Calculation: 444 R Axis:   4 Text Interpretation:  Sinus tachycardia Borderline T wave abnormalities  Confirmed by Denton Lank  MD, Caryn Bee ([id number]) on 12/12/2015 10:35:41 AM      MDM   Labs.  Behavioral health team consulted.  Reviewed nursing notes and prior charts for additional history.   Ativan po.   Psych holding orders.   Recheck, pt very calm, alert, conversant. Normal mood/affect.  Pt/fam have declined inpatient psych tx, and prefer to follow up with her psychiatrist as outpt.  Pt denies any SI.  Pt/mother feel rx ativan at home may help - discussed will give small quantity rx until can see their provider.  Pt currently appears stable for d/c     Cathren Laine, MD 12/12/15 1218

## 2015-12-12 NOTE — ED Notes (Signed)
md at bedside

## 2015-12-12 NOTE — ED Notes (Addendum)
Pt crying and shaking while talking. Pt here with her mother, pt lives with her mother, has 2 children, youngest 5218 months. Mother thinks pt has postpartum. Pt dx with bipolar. Was recently started on new bipolar medication 2 weeks ago (Valmir? A new bipolar medicine). Pt takes cymbalta for depression, says "it doesn't work". Pt dx ADHD and prescribed 60mg  adderal, but takes 100 mg daily, pt explains that she was prescribed 60mg  dose when she was 70 pounds lighter, but none of her psychiatrist will up her dose, she has been offered prescription for vyvanse instead, but pt refuses, saying "other people that have taken it say it doesn't work". Pt reports she has to take the adderal to combat her desire to do nothing, and it helps her. She has been told in past that she has been self treating bipolar with adderal. Pt took last dose of adderal 2 days ago. Denies ETOH or other drug use. Current cigarette smoker.   Pt reports she is depressed, has thoughts about death, but does not want to kill herself. Would not harm herself because of her kids.no hx of self harm. Pt reports generalized body aches 10/10.   Pt denies SI/HI, AH/VH.

## 2015-12-13 MED ORDER — LORAZEPAM 1 MG PO TABS: 1.0000 mg | ORAL_TABLET | Freq: Three times a day (TID) | ORAL | Status: AC | PRN

## 2015-12-13 NOTE — ED Notes (Signed)
12/13/15 2130 Pt and pts mother called charge RN, they state they did not receive the Ativan prescription with d/c instructions. They thought the medication was called in to a pharmacy. Department searched for script, unable to find. Rodman KeyJanet Webb, RN d/c patient yesterday, I spoke with janet, she confirms no script was given to patient during d/c. Dr. Silverio LayYao reprinted script for patient Ativan 1mg  15tabs. Toni Amend. [place] called at [phone], left message

## 2016-05-12 ENCOUNTER — Encounter (HOSPITAL_COMMUNITY): Payer: Self-pay | Admitting: Emergency Medicine

## 2016-05-12 ENCOUNTER — Emergency Department (HOSPITAL_COMMUNITY)
Admission: EM | Admit: 2016-05-12 | Discharge: 2016-05-12 | Disposition: A | Discharge status: Home / Self Care | Payer: Medicaid Other | Attending: Emergency Medicine | Admitting: Emergency Medicine

## 2016-05-12 DIAGNOSIS — F418 Other specified anxiety disorders: Secondary | ICD-10-CM | POA: Insufficient documentation

## 2016-05-12 DIAGNOSIS — F909 Attention-deficit hyperactivity disorder, unspecified type: Secondary | ICD-10-CM | POA: Diagnosis not present

## 2016-05-12 DIAGNOSIS — Z79899 Other long term (current) drug therapy: Secondary | ICD-10-CM | POA: Diagnosis not present

## 2016-05-12 DIAGNOSIS — F17210 Nicotine dependence, cigarettes, uncomplicated: Secondary | ICD-10-CM | POA: Insufficient documentation

## 2016-05-12 DIAGNOSIS — F419 Anxiety disorder, unspecified: Secondary | ICD-10-CM

## 2016-05-12 LAB — BASIC METABOLIC PANEL
Anion gap: 5 (ref 5–15)
BUN: 7 mg/dL (ref 6–20)
CO2: 28 mmol/L (ref 22–32)
Calcium: 9.1 mg/dL (ref 8.9–10.3)
Chloride: 104 mmol/L (ref 101–111)
Creatinine, Ser: 0.57 mg/dL (ref 0.44–1.00)
GFR calc Af Amer: >60 mL/min (ref >60)
GFR calc non Af Amer: >60 mL/min (ref >60)
Glucose, Bld: 88 mg/dL (ref 65–99)
Potassium: 3.7 mmol/L (ref 3.5–5.1)
Sodium: 137 mmol/L (ref 135–145)

## 2016-05-12 LAB — CBC WITH DIFFERENTIAL/PLATELET
Basophils Absolute: 0 10*3/uL (ref 0.0–0.1)
Basophils Relative: 0 %
Eosinophils Absolute: 0.4 10*3/uL (ref 0.0–0.7)
Eosinophils Relative: 4 %
HCT: 41.6 % (ref 36.0–46.0)
Hemoglobin: 13.7 g/dL (ref 12.0–15.0)
Lymphocytes Relative: 32 %
Lymphs Abs: 3.2 10*3/uL (ref 0.7–4.0)
MCH: 29.1 pg (ref 26.0–34.0)
MCHC: 32.9 g/dL (ref 30.0–36.0)
MCV: 88.5 fL (ref 78.0–100.0)
Monocytes Absolute: 0.5 10*3/uL (ref 0.1–1.0)
Monocytes Relative: 5 %
Neutro Abs: 5.8 10*3/uL (ref 1.7–7.7)
Neutrophils Relative %: 59 %
Platelets: 300 10*3/uL (ref 150–400)
RBC: 4.7 MIL/uL (ref 3.87–5.11)
RDW: 12.9 % (ref 11.5–15.5)
WBC: 9.8 10*3/uL (ref 4.0–10.5)

## 2016-05-12 LAB — PREGNANCY, URINE: Preg Test, Ur: NEGATIVE

## 2016-05-12 LAB — RAPID URINE DRUG SCREEN, HOSP PERFORMED
Amphetamines: POSITIVE — AB
Barbiturates: NOT DETECTED
Benzodiazepines: NOT DETECTED
Cocaine: NOT DETECTED
Opiates: NOT DETECTED
Tetrahydrocannabinol: NOT DETECTED

## 2016-05-12 LAB — ETHANOL: Alcohol, Ethyl (B): <5 mg/dL (ref <5)

## 2016-05-12 MED ORDER — IBUPROFEN 800 MG PO TABS
800.0000 mg | ORAL_TABLET | Freq: Once | ORAL | Status: AC
  Administered 2016-05-12: 800 mg via ORAL
  Filled 2016-05-12: qty 1

## 2016-05-12 MED ORDER — ONDANSETRON HCL 4 MG PO TABS: 4.0000 mg | ORAL_TABLET | Freq: Three times a day (TID) | ORAL | Status: DC | PRN

## 2016-05-12 MED ORDER — ZOLPIDEM TARTRATE 5 MG PO TABS: 5.0000 mg | ORAL_TABLET | Freq: Every evening | ORAL | Status: DC | PRN

## 2016-05-12 MED ORDER — ACETAMINOPHEN 325 MG PO TABS: 650.0000 mg | ORAL_TABLET | ORAL | Status: DC | PRN

## 2016-05-12 MED ORDER — ALUM & MAG HYDROXIDE-SIMETH 200-200-20 MG/5ML PO SUSP
30.0000 mL | ORAL | Status: DC | PRN
Start: 2016-05-12 — End: 2016-05-13

## 2016-05-12 MED ORDER — IBUPROFEN 200 MG PO TABS: 600.0000 mg | ORAL_TABLET | Freq: Three times a day (TID) | ORAL | Status: DC | PRN

## 2016-05-12 NOTE — ED Notes (Signed)
Unable to collect labs TTS is in the room talking with patient

## 2016-05-12 NOTE — Discharge Instructions (Signed)
You were seen and evaluated today for increasing anxiety. Please follow-up outpatient as soon as possible. Return to the emergency Department with worsening symptoms or any suicidal ideation or thoughts of harming herself or anyone else.  Generalized Anxiety Disorder Generalized anxiety disorder (GAD) is a mental disorder. It interferes with life functions, including relationships, work, and school. GAD is different from normal anxiety, which everyone experiences at some point in their lives in response to specific life events and activities. Normal anxiety actually helps us prepare for and get through these life events and activities. Normal anxiety goes away after the event or activity is over.  GAD causes anxiety that is not necessarily related to specific events or activities. It also causes excess anxiety in proportion to specific events or activities. The anxiety associated with GAD is also difficult to control. GAD can vary from mild to severe. People with severe GAD can have intense waves of anxiety with physical symptoms (panic attacks).  SYMPTOMS The anxiety and worry associated with GAD are difficult to control. This anxiety and worry are related to many life events and activities and also occur more days than not for 6 months or longer. People with GAD also have three or more of the following symptoms (one or more in children):  Restlessness.   Fatigue.  Difficulty concentrating.   Irritability.  Muscle tension.  Difficulty sleeping or unsatisfying sleep. DIAGNOSIS GAD is diagnosed through an assessment by your health care provider. Your health care provider will ask you questions aboutyour mood,physical symptoms, and events in your life. Your health care provider may ask you about your medical history and use of alcohol or drugs, including prescription medicines. Your health care provider may also do a physical exam and blood tests. Certain medical conditions and the use of  certain substances can cause symptoms similar to those associated with GAD. Your health care provider may refer you to a mental health specialist for further evaluation. TREATMENT The following therapies are usually used to treat GAD:   Medication. Antidepressant medication usually is prescribed for long-term daily control. Antianxiety medicines may be added in severe cases, especially when panic attacks occur.   Talk therapy (psychotherapy). Certain types of talk therapy can be helpful in treating GAD by providing support, education, and guidance. A form of talk therapy called cognitive behavioral therapy can teach you healthy ways to think about and react to daily life events and activities.  Stress managementtechniques. These include yoga, meditation, and exercise and can be very helpful when they are practiced regularly. A mental health specialist can help determine which treatment is best for you. Some people see improvement with one therapy. However, other people require a combination of therapies.   This information is not intended to replace advice given to you by your health care provider. Make sure you discuss any questions you have with your health care provider.   Document Released: 03/27/2013 Document Revised: 12/21/2014 Document Reviewed: 03/27/2013 Elsevier Interactive Patient Education [employer]2016 [employer]vier Inc.

## 2016-05-12 NOTE — ED Notes (Signed)
Pt presents with depression and increasing anxiety.  Denies SI,HI or AVH.  Pt crying tearful, stating she wants to go home to be with her kids.  AAO x 3, no distress noted, cooperative at present.  Monitoring for safety, Q 15 min checks in effect.  TTS Ford at bedside to speak with pt.

## 2016-05-12 NOTE — ED Provider Notes (Signed)
CSN: [id number]     Arrival date & time 05/12/16  1618 History   First MD Initiated Contact with Patient 05/12/16 1706     Chief Complaint  Patient presents with  . Depression     (Consider location/radiation/quality/duration/timing/severity/associated sxs/prior Treatment) HPI Patient presents to the emergency department with her sending depression and fleeting suicidal thoughts.  The patient states that these been worsening over the last week.  The patient states that she mainly is having worsening anxiety and depression.  She felt like this is the only place she can come for help.  She states she would never harm herself to infectious children and she could not do that herself.The patient denies chest pain, shortness of breath, headache,blurred vision, neck pain, fever, cough, weakness, numbness, dizziness, anorexia, edema, abdominal pain, nausea, vomiting, diarrhea, rash, back pain, dysuria, hematemesis, bloody stool, near syncope, or syncope. Past Medical History  Diagnosis Date  . ADHD (attention deficit hyperactivity disorder)   . Panic attacks   . Depression   . BV (bacterial vaginosis)   . Anemia   . MRSA cellulitis    Past Surgical History  Procedure Laterality Date  . Induced abortion     Family History  Problem Relation Age of Onset  . Diabetes Brother   . Gallstones Mother    Social History  Substance Use Topics  . Smoking status: Current Every Day Smoker -- 1.00 packs/day    Types: Cigarettes  . Smokeless tobacco: Never Used  . Alcohol Use: No   OB History    Gravida Para Term Preterm AB TAB SAB Ectopic Multiple Living   3 2 2  0 1 1 0 0 0 2     Review of Systems All other systems negative except as documented in the HPI. All pertinent positives and negatives as reviewed in the HPI.   Allergies  Review of patient's allergies indicates no known allergies.  Home Medications   Prior to Admission medications   Medication Sig Start Date End Date Taking?  Authorizing Provider  amphetamine-dextroamphetamine (ADDERALL) 20 MG tablet Take 20 mg by mouth 3 (three) times daily.   Yes Historical Provider, MD  diphenhydrAMINE (BENADRYL) 25 MG tablet Take 200 mg by mouth at bedtime.   Yes Historical Provider, MD  DULoxetine (CYMBALTA) 60 MG capsule Take 60 mg by mouth daily.   Yes Historical Provider, MD  Chlorhexidine Gluconate 2 % LIQD Apply 1 Tube topically once a week. Patient not taking: Reported on 05/29/2015 01/08/15   Ginnie SmartJeffrey C Hatcher, MD  LORazepam (ATIVAN) 1 MG tablet Take 1 tablet (1 mg total) by mouth 3 (three) times daily as needed for anxiety. Patient not taking: Reported on 05/12/2016 12/13/15   Richardean Canalavid H Yao, MD  Oritavancin Diphosphate (ORBACTIV) 400 MG SOLR Inject 1,200 mg into the vein once. Patient not taking: Reported on 07/01/2015 05/29/15   Ginnie SmartJeffrey C Hatcher, MD   BP 130/71 mmHg  Pulse 90  Temp(Src) 97.9 F (36.6 C) (Oral)  Resp 17  SpO2 100% Physical Exam  Constitutional: She is oriented to person, place, and time. She appears well-developed and well-nourished. No distress.  HENT:  Head: Normocephalic and atraumatic.  Mouth/Throat: Oropharynx is clear and moist.  Eyes: Pupils are equal, round, and reactive to light.  Neck: Normal range of motion. Neck supple.  Cardiovascular: Normal rate, regular rhythm and normal heart sounds.  Exam reveals no gallop and no friction rub.   No murmur heard. Pulmonary/Chest: Effort normal and breath sounds normal. No respiratory distress.  She has no wheezes.  Abdominal: Soft. Bowel sounds are normal. She exhibits no distension. There is no tenderness.  Neurological: She is alert and oriented to person, place, and time. She exhibits normal muscle tone. Coordination normal.  Skin: Skin is warm and dry. No rash noted. No erythema.  Psychiatric: Her behavior is normal. Her mood appears anxious. She exhibits a depressed mood. She expresses no homicidal ideation. She expresses no suicidal plans and no  homicidal plans.  Nursing note and vitals reviewed.   ED Course  Procedures (including critical care time) Labs Review Labs Reviewed  URINE RAPID DRUG SCREEN, HOSP PERFORMED - Abnormal; Notable for the following:    Amphetamines POSITIVE (*)    All other components within normal limits  BASIC METABOLIC PANEL  CBC WITH DIFFERENTIAL/PLATELET  ETHANOL  PREGNANCY, URINE    Imaging Review No results found. I have personally reviewed and evaluated these images and lab results as part of my medical decision-making.  Patient need TTS assessment for her increasing depression and fleeting thoughts of suicide    Charlestine Night, PA-C 05/12/16 2125  Arby Barrette, MD 05/20/16 [phone]

## 2016-05-12 NOTE — ED Notes (Signed)
Per pt, states she is depressed-increased stress-states things are "bad and at a bad point"-

## 2016-05-12 NOTE — BH Assessment (Signed)
Per Catha NottinghamJamison, NP - patient will remain in the ED overnight until the morning for a final disposition.

## 2016-05-12 NOTE — ED Notes (Signed)
Visitor at bedside at present. No distress noted.
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
## 2016-05-12 NOTE — ED Provider Notes (Signed)
I was asked to evaluate the patient at bedside. Patient denies suicidal or homicidal ideation. She reports she would never hurt herself as she has 2 young children. She would like to attend her sons board assembly tomorrow. Her mother is at bedside. The mother lives with the patient and her children. The mother said that she would make sure that the patient states he they would return if she had any suicidal ideation. Patient has a therapist who she will contact in the morning. She also follows with a psychiatrist outpatient. At this time the patient appears stable for discharge.  Leta BaptistEmily Roe Nguyen, MD 05/12/16 2112

## 2016-05-12 NOTE — ED Notes (Signed)
Pt has been seen and wanded by security.  Has 1 bag of belongings.

## 2016-05-15 ENCOUNTER — Ambulatory Visit (HOSPITAL_COMMUNITY)
Admission: RE | Admit: 2016-05-15 | Discharge: 2016-05-15 | Disposition: A | Discharge status: Home / Self Care | Payer: Medicaid Other | Attending: Psychiatry | Admitting: Psychiatry

## 2016-05-15 DIAGNOSIS — F17210 Nicotine dependence, cigarettes, uncomplicated: Secondary | ICD-10-CM | POA: Diagnosis not present

## 2016-05-15 DIAGNOSIS — F331 Major depressive disorder, recurrent, moderate: Secondary | ICD-10-CM | POA: Insufficient documentation

## 2016-05-15 DIAGNOSIS — D649 Anemia, unspecified: Secondary | ICD-10-CM | POA: Insufficient documentation

## 2016-05-15 DIAGNOSIS — F909 Attention-deficit hyperactivity disorder, unspecified type: Secondary | ICD-10-CM | POA: Diagnosis not present

## 2016-05-15 DIAGNOSIS — A4902 Methicillin resistant Staphylococcus aureus infection, unspecified site: Secondary | ICD-10-CM | POA: Diagnosis not present
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
## 2017-06-07 ENCOUNTER — Ambulatory Visit (HOSPITAL_COMMUNITY)
Admission: RE | Admit: 2017-06-07 | Discharge: 2017-06-07 | Disposition: A | Discharge status: Home / Self Care | Payer: Medicaid Other | Attending: Psychiatry | Admitting: Psychiatry

## 2017-06-07 DIAGNOSIS — F17210 Nicotine dependence, cigarettes, uncomplicated: Secondary | ICD-10-CM | POA: Insufficient documentation

## 2017-06-07 DIAGNOSIS — F909 Attention-deficit hyperactivity disorder, unspecified type: Secondary | ICD-10-CM | POA: Insufficient documentation

## 2017-06-07 DIAGNOSIS — F1510 Other stimulant abuse, uncomplicated: Secondary | ICD-10-CM | POA: Diagnosis not present

## 2017-06-07 DIAGNOSIS — F332 Major depressive disorder, recurrent severe without psychotic features: Secondary | ICD-10-CM | POA: Diagnosis not present

## 2017-06-07 DIAGNOSIS — Z833 Family history of diabetes mellitus: Secondary | ICD-10-CM | POA: Diagnosis not present

## 2017-06-07 DIAGNOSIS — R45851 Suicidal ideations: Secondary | ICD-10-CM | POA: Insufficient documentation
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
## 2017-06-07 NOTE — H&P (Signed)
Behavioral Health Medical Screening Exam  Melissa Levine is an 26 y.o. female with a hx of bipolar disorder, ADD and depression [facility]o overwhelming depression with passive suicide ideations. Patient stated, " my kids are the only reason I'm alive. I don't find pleasure in anything else. I feel like I'm trapped in this world for my kids. I don't want to do anything to hurt myself especially since my 788 year old son lost his father last year in an accident. I need help to feel better. I can't continue this way any longer".  Total Time spent with patient: 30 minutes  Psychiatric Specialty Exam: Physical Exam  Vitals reviewed. Constitutional: She is oriented to person, place, and time. She appears well-developed and well-nourished.  HENT:  Head: Normocephalic and atraumatic.  Eyes: Pupils are equal, round, and reactive to light.  Neck: Normal range of motion.  Cardiovascular: Normal rate, regular rhythm and normal heart sounds.   Respiratory: Effort normal and breath sounds normal.  GI: Soft. Bowel sounds are normal.  Genitourinary:  Genitourinary Comments: Deferred   Musculoskeletal: Normal range of motion.  Neurological: She is alert and oriented to person, place, and time.  Skin: Skin is warm and dry.    Review of Systems  Psychiatric/Behavioral: Positive for depression and substance abuse (amphitamine abuse). Negative for hallucinations, memory loss and suicidal ideas (passive ideations w/o plans). The patient is nervous/anxious. The patient does not have insomnia.   All other systems reviewed and are negative.   Blood pressure 136/76, pulse 98, temperature 98.6 F (37 C), temperature source Oral, resp. rate 18, SpO2 98 %.There is no height or weight on file to calculate BMI.  General Appearance: unremarkable, casually dressed  Eye Contact:  Good  Speech:  Clear and Coherent and Normal Rate  Volume:  Normal  Mood:  Anxious, Depressed, Hopeless and  tearful  Affect:  Depressed, Tearful and anxious  Thought Process:  Coherent and Goal Directed  Orientation:  Full (Time, Place, and Person)  Thought Content:  WDL and Logical  Suicidal Thoughts:  Yes.  without intent/plan  Homicidal Thoughts:  No  Memory:  Immediate;   Good Recent;   Good Remote;   Good  Judgement:  Good  Insight:  Present  Psychomotor Activity:  Normal  Concentration: Concentration: Good and Attention Span: Good  Recall:  Good  Fund of Knowledge:Good  Language: Good  Akathisia:  Negative  Handed:  Right  AIMS (if indicated):     Assets:  Communication Skills Desire for Improvement Financial Resources/Insurance Housing Leisure Time Physical Health Social Support Talents/Skills  Sleep:       Musculoskeletal: Strength & Muscle Tone: within normal limits Gait & Station: normal Patient leans: N/A  Blood pressure 136/76, pulse 98, temperature 98.6 F (37 C), temperature source Oral, resp. rate 18, SpO2 98 %.  Recommendations:  Based on my evaluation, patient will seem to benefit from inpatient hospitalization for medication management and stabilization. However, patient declined inpatient hospitalization at this time and prefers to do OP follow up. Patient stated that she is committed to doing the right thing and is ready to seek help. Patient contracts for safety. OP resources were provided to patient.   Delila PereyraJustina A Okonkwo, NP 06/07/2017, 2:09 PM

## 2017-06-07 NOTE — BH Assessment (Signed)
Patient verbalizes "I want to go home, I have so much support at home that I will go home and follow up with my primary care provider." Patient assessed by NP, given outpatient resources. Patient offered support and encouragement.

## 2024-02-14 ENCOUNTER — Other Ambulatory Visit (HOSPITAL_BASED_OUTPATIENT_CLINIC_OR_DEPARTMENT_OTHER): Payer: Self-pay

## 2024-02-14 MED ORDER — AMPHETAMINE-DEXTROAMPHETAMINE 20 MG PO TABS
20.0000 mg | ORAL_TABLET | Freq: Three times a day (TID) | ORAL | 0 refills | Status: DC
  Filled 2024-02-14: qty 90, 30d supply, fill #0

## 2024-03-13 ENCOUNTER — Other Ambulatory Visit (HOSPITAL_BASED_OUTPATIENT_CLINIC_OR_DEPARTMENT_OTHER): Payer: Self-pay

## 2024-03-13 MED ORDER — AMPHETAMINE-DEXTROAMPHETAMINE 20 MG PO TABS
20.0000 mg | ORAL_TABLET | Freq: Three times a day (TID) | ORAL | 0 refills | Status: AC
  Filled 2024-03-13 – 2024-03-23 (×2): qty 90, 30d supply, fill #0

## 2024-03-14 ENCOUNTER — Other Ambulatory Visit (HOSPITAL_BASED_OUTPATIENT_CLINIC_OR_DEPARTMENT_OTHER): Payer: Self-pay

## 2024-03-24 ENCOUNTER — Other Ambulatory Visit (HOSPITAL_BASED_OUTPATIENT_CLINIC_OR_DEPARTMENT_OTHER): Payer: Self-pay

## 2024-03-24 ENCOUNTER — Other Ambulatory Visit: Payer: Self-pay

## 2024-03-25 ENCOUNTER — Other Ambulatory Visit (HOSPITAL_BASED_OUTPATIENT_CLINIC_OR_DEPARTMENT_OTHER): Payer: Self-pay

## 2024-04-17 ENCOUNTER — Other Ambulatory Visit (HOSPITAL_BASED_OUTPATIENT_CLINIC_OR_DEPARTMENT_OTHER): Payer: Self-pay

## 2024-04-20 ENCOUNTER — Other Ambulatory Visit (HOSPITAL_BASED_OUTPATIENT_CLINIC_OR_DEPARTMENT_OTHER): Payer: Self-pay

## 2024-04-20 ENCOUNTER — Encounter (HOSPITAL_BASED_OUTPATIENT_CLINIC_OR_DEPARTMENT_OTHER): Payer: Self-pay

## 2024-04-25 ENCOUNTER — Other Ambulatory Visit (HOSPITAL_BASED_OUTPATIENT_CLINIC_OR_DEPARTMENT_OTHER): Payer: Self-pay

## 2024-04-26 ENCOUNTER — Other Ambulatory Visit (HOSPITAL_BASED_OUTPATIENT_CLINIC_OR_DEPARTMENT_OTHER): Payer: Self-pay

## 2024-04-28 ENCOUNTER — Other Ambulatory Visit (HOSPITAL_BASED_OUTPATIENT_CLINIC_OR_DEPARTMENT_OTHER): Payer: Self-pay

## 2024-04-29 ENCOUNTER — Other Ambulatory Visit (HOSPITAL_BASED_OUTPATIENT_CLINIC_OR_DEPARTMENT_OTHER): Payer: Self-pay

## 2024-05-03 ENCOUNTER — Other Ambulatory Visit (HOSPITAL_BASED_OUTPATIENT_CLINIC_OR_DEPARTMENT_OTHER): Payer: Self-pay

## 2024-05-04 ENCOUNTER — Other Ambulatory Visit (HOSPITAL_BASED_OUTPATIENT_CLINIC_OR_DEPARTMENT_OTHER): Payer: Self-pay

## 2024-07-26 ENCOUNTER — Other Ambulatory Visit (HOSPITAL_BASED_OUTPATIENT_CLINIC_OR_DEPARTMENT_OTHER): Payer: Self-pay

## 2024-07-26 MED ORDER — AMPHETAMINE-DEXTROAMPHETAMINE 20 MG PO TABS
20.0000 mg | ORAL_TABLET | Freq: Three times a day (TID) | ORAL | 0 refills | Status: DC
  Filled 2024-09-25: qty 90, 30d supply, fill #0

## 2024-07-26 MED ORDER — AMPHETAMINE-DEXTROAMPHETAMINE 20 MG PO TABS
20.0000 mg | ORAL_TABLET | Freq: Three times a day (TID) | ORAL | 0 refills | Status: AC
  Filled 2024-08-26: qty 90, 30d supply, fill #0

## 2024-07-26 MED ORDER — AMPHETAMINE-DEXTROAMPHETAMINE 20 MG PO TABS
20.0000 mg | ORAL_TABLET | Freq: Three times a day (TID) | ORAL | 0 refills | Status: AC
  Filled 2024-07-26 (×2): qty 90, 30d supply, fill #0

## 2024-07-27 ENCOUNTER — Other Ambulatory Visit (HOSPITAL_BASED_OUTPATIENT_CLINIC_OR_DEPARTMENT_OTHER): Payer: Self-pay

## 2024-07-27 ENCOUNTER — Other Ambulatory Visit: Payer: Self-pay

## 2024-08-24 ENCOUNTER — Other Ambulatory Visit (HOSPITAL_BASED_OUTPATIENT_CLINIC_OR_DEPARTMENT_OTHER): Payer: Self-pay

## 2024-08-26 ENCOUNTER — Other Ambulatory Visit (HOSPITAL_BASED_OUTPATIENT_CLINIC_OR_DEPARTMENT_OTHER): Payer: Self-pay

## 2024-09-25 ENCOUNTER — Other Ambulatory Visit: Payer: Self-pay

## 2024-09-25 ENCOUNTER — Other Ambulatory Visit (HOSPITAL_BASED_OUTPATIENT_CLINIC_OR_DEPARTMENT_OTHER): Payer: Self-pay

## 2024-10-24 ENCOUNTER — Other Ambulatory Visit (HOSPITAL_BASED_OUTPATIENT_CLINIC_OR_DEPARTMENT_OTHER): Payer: Self-pay

## 2024-10-24 MED ORDER — AMPHETAMINE-DEXTROAMPHETAMINE 20 MG PO TABS
20.0000 mg | ORAL_TABLET | Freq: Three times a day (TID) | ORAL | 0 refills | Status: AC
  Filled 2024-10-24: qty 90, 30d supply, fill #0

## 2024-10-25 ENCOUNTER — Other Ambulatory Visit (HOSPITAL_BASED_OUTPATIENT_CLINIC_OR_DEPARTMENT_OTHER): Payer: Self-pay

## 2024-11-01 ENCOUNTER — Other Ambulatory Visit (HOSPITAL_BASED_OUTPATIENT_CLINIC_OR_DEPARTMENT_OTHER): Payer: Self-pay

## 2024-11-23 ENCOUNTER — Other Ambulatory Visit (HOSPITAL_BASED_OUTPATIENT_CLINIC_OR_DEPARTMENT_OTHER): Payer: Self-pay

## 2024-11-30 ENCOUNTER — Other Ambulatory Visit (HOSPITAL_BASED_OUTPATIENT_CLINIC_OR_DEPARTMENT_OTHER): Payer: Self-pay

## 2024-11-30 ENCOUNTER — Encounter (HOSPITAL_BASED_OUTPATIENT_CLINIC_OR_DEPARTMENT_OTHER): Payer: Self-pay

## 2024-12-01 ENCOUNTER — Other Ambulatory Visit (HOSPITAL_BASED_OUTPATIENT_CLINIC_OR_DEPARTMENT_OTHER): Payer: Self-pay
